# Patient Record
Sex: Female | Born: 1950
Health system: Southern US, Community
[De-identification: ages and names within clinical notes are randomized; demographics above are authoritative.]

## PROBLEM LIST (undated history)

## (undated) DIAGNOSIS — E119 Type 2 diabetes mellitus without complications: Secondary | ICD-10-CM

## (undated) DIAGNOSIS — E785 Hyperlipidemia, unspecified: Secondary | ICD-10-CM

## (undated) DIAGNOSIS — K219 Gastro-esophageal reflux disease without esophagitis: Secondary | ICD-10-CM

## (undated) DIAGNOSIS — M1990 Unspecified osteoarthritis, unspecified site: Secondary | ICD-10-CM

## (undated) DIAGNOSIS — N189 Chronic kidney disease, unspecified: Secondary | ICD-10-CM

## (undated) DIAGNOSIS — I10 Essential (primary) hypertension: Secondary | ICD-10-CM

## (undated) DIAGNOSIS — F419 Anxiety disorder, unspecified: Secondary | ICD-10-CM

## (undated) HISTORY — PX: EYE SURGERY: SHX253

## (undated) HISTORY — PX: ABDOMINAL HYSTERECTOMY: SHX81

## (undated) HISTORY — PX: COLONOSCOPY: SHX174

## (undated) HISTORY — PX: ADENOIDECTOMY: SUR15

## (undated) HISTORY — PX: ROTATOR CUFF REPAIR: SHX139

## (undated) HISTORY — PX: TONSILLECTOMY: SUR1361

---

## 2001-09-28 ENCOUNTER — Emergency Department (HOSPITAL_COMMUNITY): Admission: EM | Admit: 2001-09-28 | Discharge: 2001-09-28 | Payer: Self-pay

## 2001-09-28 ENCOUNTER — Encounter: Payer: Self-pay | Admitting: Emergency Medicine

## 2004-02-22 ENCOUNTER — Ambulatory Visit (HOSPITAL_COMMUNITY): Admission: RE | Admit: 2004-02-22 | Discharge: 2004-02-22 | Payer: Self-pay | Admitting: Orthopaedic Surgery

## 2004-02-22 ENCOUNTER — Ambulatory Visit (HOSPITAL_BASED_OUTPATIENT_CLINIC_OR_DEPARTMENT_OTHER): Admission: RE | Admit: 2004-02-22 | Discharge: 2004-02-22 | Payer: Self-pay | Admitting: Orthopaedic Surgery

## 2005-02-22 ENCOUNTER — Ambulatory Visit (HOSPITAL_COMMUNITY): Admission: RE | Admit: 2005-02-22 | Discharge: 2005-02-23 | Payer: Self-pay | Admitting: Ophthalmology

## 2008-09-27 ENCOUNTER — Ambulatory Visit: Payer: Self-pay | Admitting: Internal Medicine

## 2008-10-15 ENCOUNTER — Ambulatory Visit: Payer: Self-pay | Admitting: Surgery

## 2008-10-25 ENCOUNTER — Ambulatory Visit: Payer: Self-pay | Admitting: Surgery

## 2010-02-04 IMAGING — NM NUCLEAR MEDICINE HEPATOHBILIARY INCLUDE GB
2 series · 12 of 12 positions shown · non-contrast
Comparison: none

REASON FOR EXAM: RUQ ABD PAIN HIDA SCAN IF US NORMAL
COMMENTS:

[Series 1000: gallbladder dynamic (results) · 4.80mm/px · 6 of 60 frames shown]
[frame 6/60]
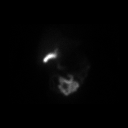
[frame 16/60]
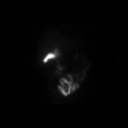
[frame 26/60]
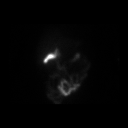
[frame 36/60]
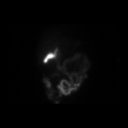
[frame 46/60]
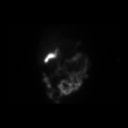
[frame 56/60]
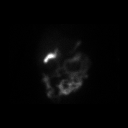

[Series 1000: gallbladder dynamic · 4.80mm/px · 6 of 60 frames shown]
[frame 6/60]
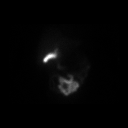
[frame 16/60]
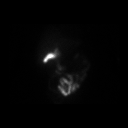
[frame 26/60]
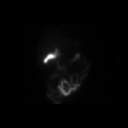
[frame 36/60]
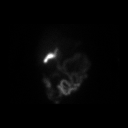
[frame 46/60]
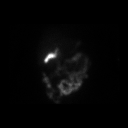
[frame 56/60]
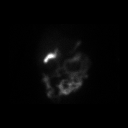

[12 of 12 positions shown; findings below may reference images not displayed]

PROCEDURE:     NM  - NM HEPATO WITH GB EJECT FRACTION  - September 27, 2008 [DATE]

RESULT:     The patient received 6.62 mCi of technetium 99m labeled Choletec
for this study. The patient also received an intravenous drip infusion of
2.3 mcg of sincalide over 30 minutes. There is adequate uptake of the
radiopharmaceutical by the liver. Common bile duct centrally is visible by
approximately 10 minutes and the gallbladder is visible by 15 minutes. Bowel
activity becomes evident by 40 minutes. The 30 minute gallbladder ejection
fraction is abnormally low at only 28% with 35% being the general the
generally accepted lower limit of normal.
IMPRESSION: There are findings consistent with gallbladder dysfunction
with a low ejection fraction of only 28%. I do not see evidence of
structural the common bile duct nor of the cystic duct.

## 2010-11-30 ENCOUNTER — Ambulatory Visit: Payer: Self-pay | Admitting: Internal Medicine

## 2012-02-05 ENCOUNTER — Ambulatory Visit: Payer: Self-pay | Admitting: Internal Medicine

## 2012-05-21 HISTORY — PX: CHOLECYSTECTOMY: SHX55

## 2013-04-10 ENCOUNTER — Ambulatory Visit: Payer: Self-pay | Admitting: Gastroenterology

## 2013-04-13 LAB — PATHOLOGY REPORT

## 2013-05-21 HISTORY — PX: JOINT REPLACEMENT: SHX530

## 2013-06-14 IMAGING — MG MM CAD SCREENING MAMMO
1 series · 4 of 4 positions shown · non-contrast
Comparison: none

REASON FOR EXAM: SCR MAMMO NO ORDER
COMMENTS:

PROCEDURE:     MAM - MAM DGTL SCRN MAM NO ORDER W/CAD  - February 05, 2012  [DATE]
RESULT:     Bilateral mammograms obtained reveals a nodular parenchymal
pattern with fibroglandular breasts. Benign calcifications. CAD evaluation
nonfocal. Benign calcification present.

[R CC · right · 4 of 4 slices shown]
[im 1/4]
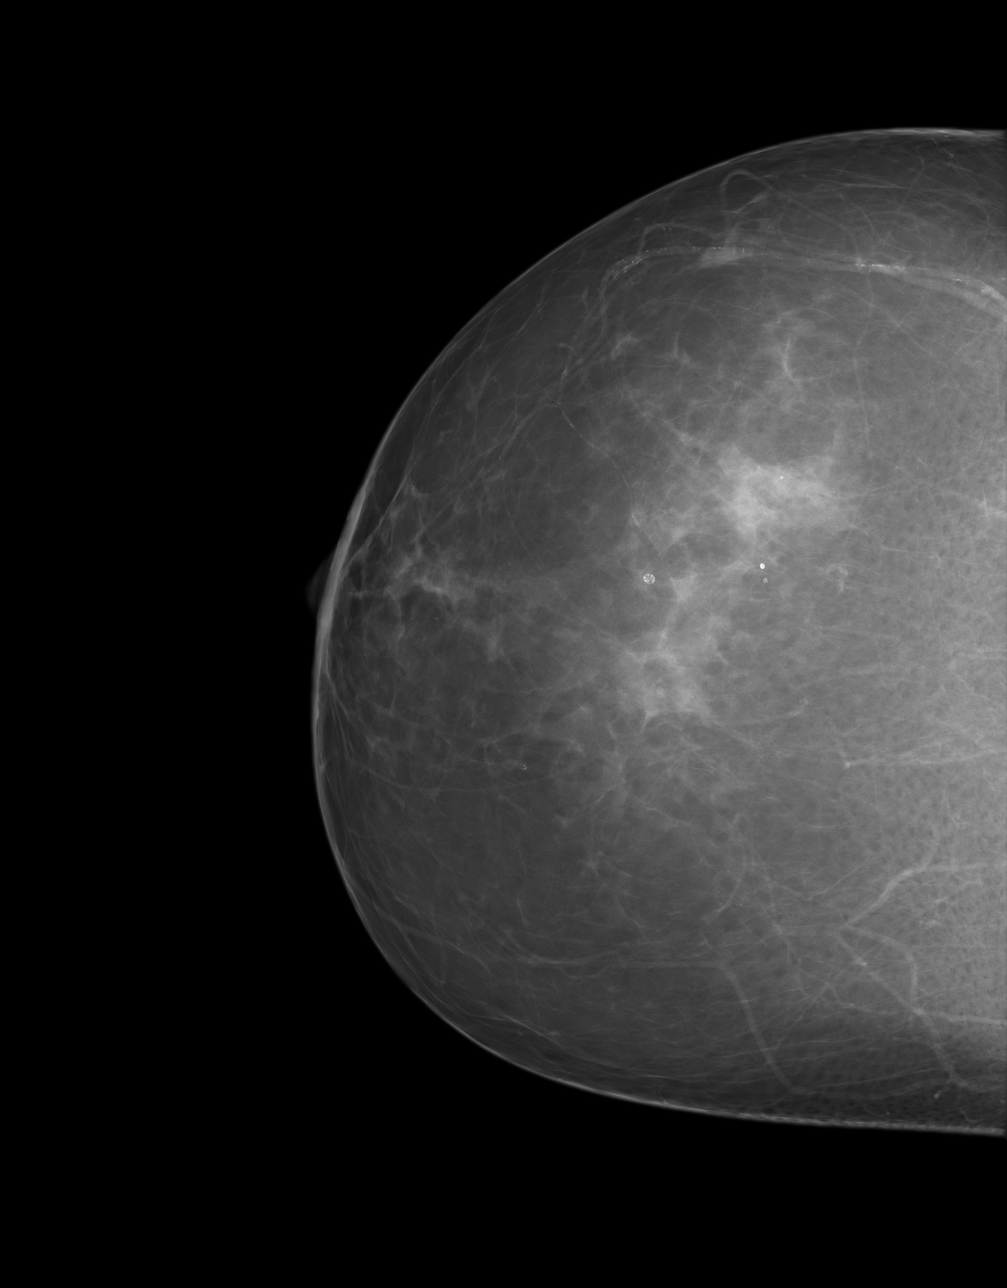
[im 2/4]
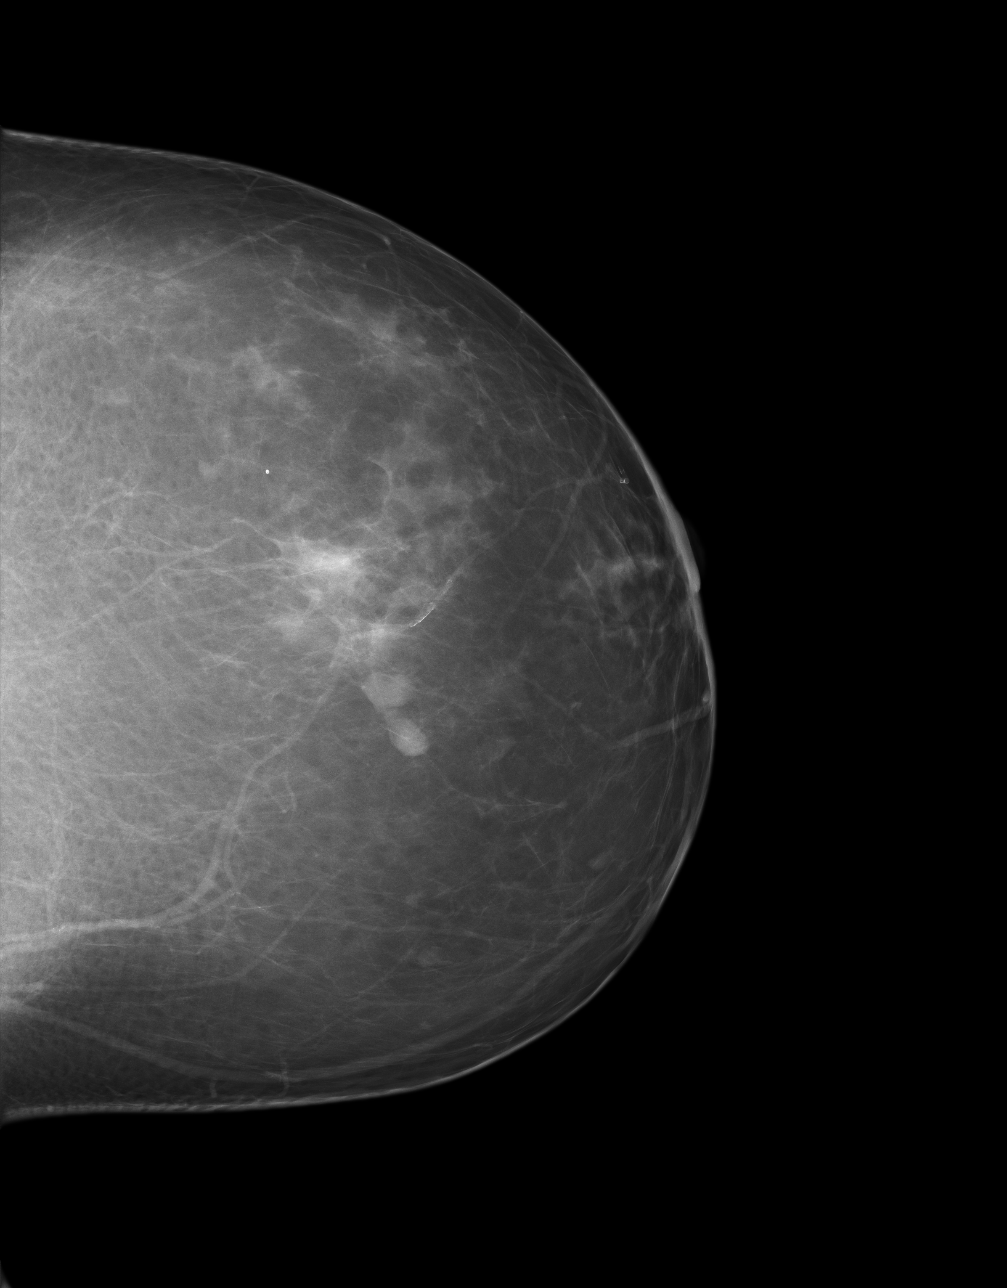
[im 3/4]
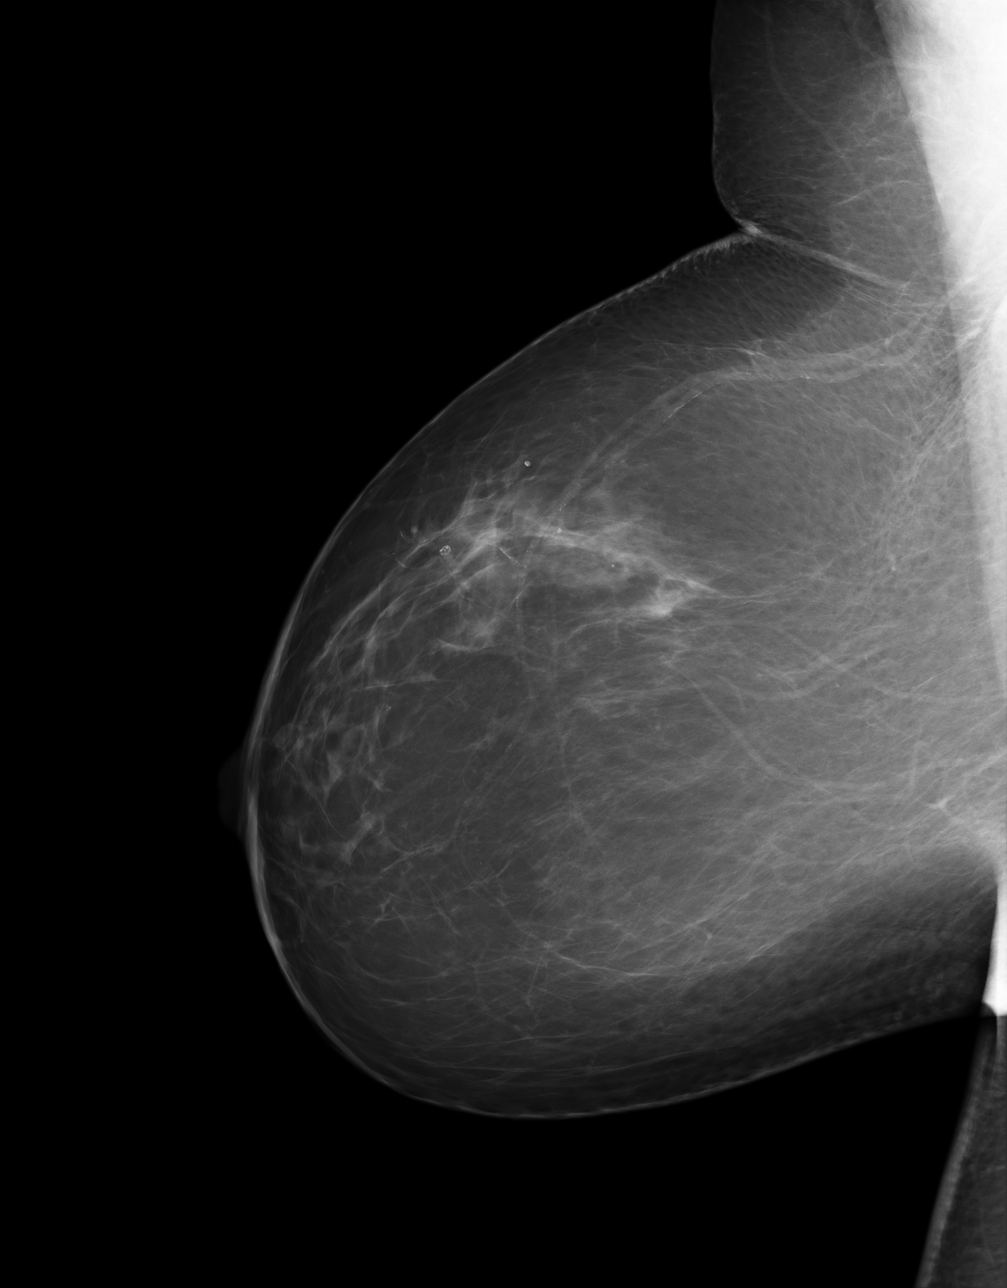
[im 4/4]
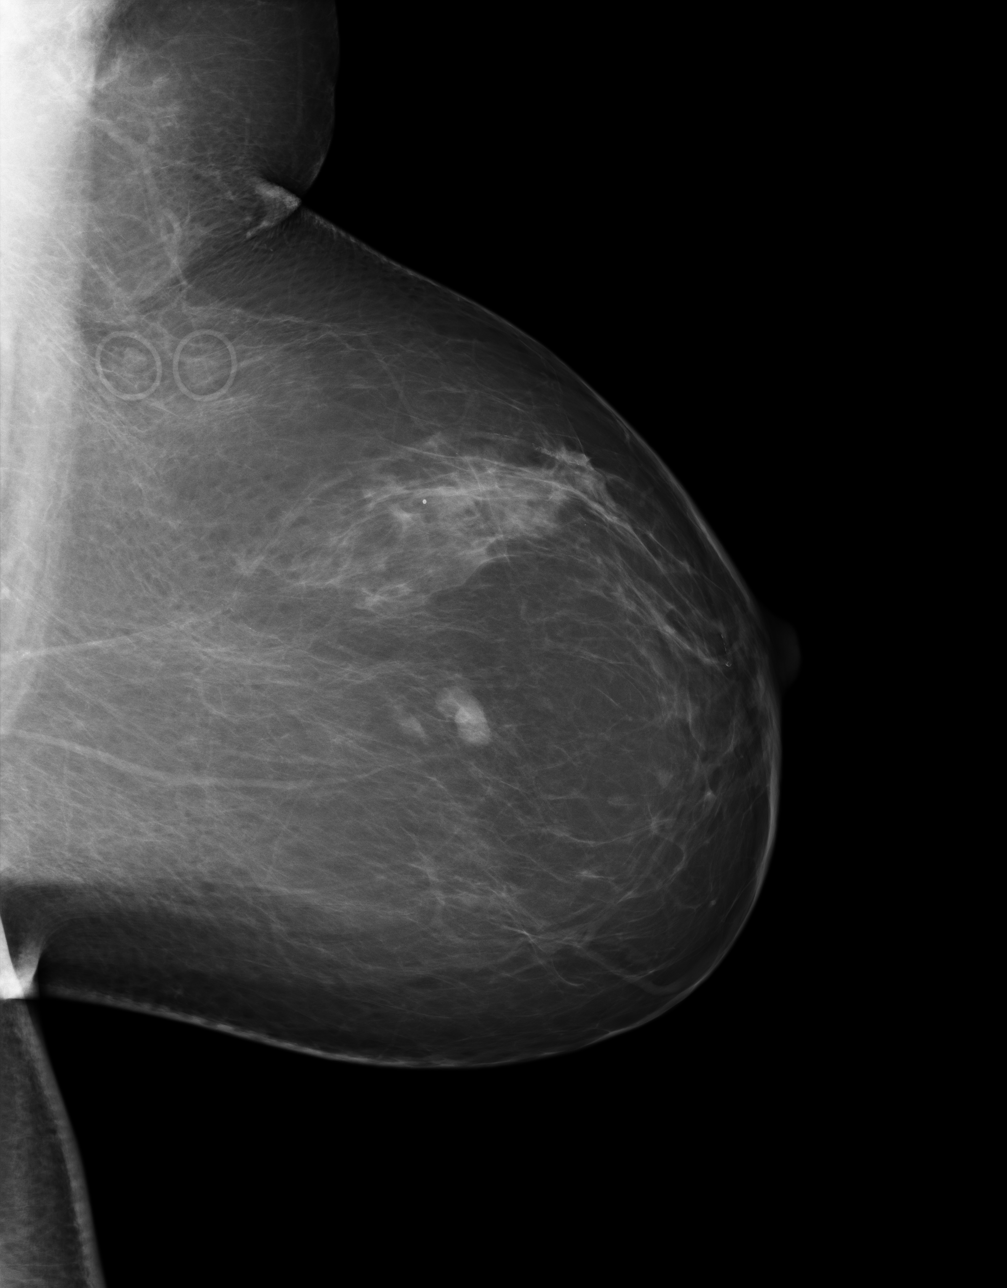

[4 of 4 positions shown; findings below may reference images not displayed]

IMPRESSION: Benign exam. Routine yearly followup barium suggested.

BI-RADS: Category 2- Benign Finding

A NEGATIVE MAMMOGRAM REPORT DOES NOT PRECLUDE BIOPSY OR OTHER EVALUATION OF
A CLINICALLY PALPABLE OR OTHERWISE SUSPICIOUS MASS OR LESION. BREAST CANCER
MAY NOT BE DETECTED IN UP TO 10% OF CASES.

## 2014-03-23 ENCOUNTER — Ambulatory Visit: Payer: Self-pay | Admitting: Internal Medicine

## 2015-03-01 ENCOUNTER — Other Ambulatory Visit: Payer: Self-pay | Admitting: Internal Medicine

## 2015-03-01 DIAGNOSIS — Z1231 Encounter for screening mammogram for malignant neoplasm of breast: Secondary | ICD-10-CM

## 2015-03-28 ENCOUNTER — Ambulatory Visit: Payer: Self-pay | Attending: Internal Medicine

## 2018-05-21 HISTORY — PX: KNEE ARTHROSCOPY: SUR90

## 2018-06-04 ENCOUNTER — Other Ambulatory Visit: Payer: Self-pay

## 2018-06-04 DIAGNOSIS — I83892 Varicose veins of left lower extremities with other complications: Secondary | ICD-10-CM

## 2018-07-15 ENCOUNTER — Encounter (HOSPITAL_COMMUNITY): Payer: Self-pay

## 2018-07-15 ENCOUNTER — Encounter: Payer: Self-pay | Admitting: Vascular Surgery

## 2018-10-21 ENCOUNTER — Ambulatory Visit (HOSPITAL_COMMUNITY): Admission: RE | Admit: 2018-10-21 | Payer: Medicare Other | Source: Ambulatory Visit

## 2018-10-21 ENCOUNTER — Encounter: Payer: Self-pay | Admitting: Vascular Surgery

## 2019-12-27 DIAGNOSIS — J019 Acute sinusitis, unspecified: Secondary | ICD-10-CM | POA: Diagnosis not present

## 2019-12-27 DIAGNOSIS — B9689 Other specified bacterial agents as the cause of diseases classified elsewhere: Secondary | ICD-10-CM | POA: Diagnosis not present

## 2020-01-12 DIAGNOSIS — E119 Type 2 diabetes mellitus without complications: Secondary | ICD-10-CM | POA: Diagnosis not present

## 2020-01-19 ENCOUNTER — Other Ambulatory Visit: Payer: Self-pay | Admitting: Internal Medicine

## 2020-01-19 DIAGNOSIS — E119 Type 2 diabetes mellitus without complications: Secondary | ICD-10-CM | POA: Diagnosis not present

## 2020-05-17 DIAGNOSIS — E119 Type 2 diabetes mellitus without complications: Secondary | ICD-10-CM | POA: Diagnosis not present

## 2020-05-24 ENCOUNTER — Other Ambulatory Visit: Payer: Self-pay | Admitting: Internal Medicine

## 2020-05-24 DIAGNOSIS — E7800 Pure hypercholesterolemia, unspecified: Secondary | ICD-10-CM | POA: Diagnosis not present

## 2020-05-24 DIAGNOSIS — Z0000 Encounter for general adult medical examination without abnormal findings: Secondary | ICD-10-CM | POA: Diagnosis not present

## 2020-05-24 DIAGNOSIS — E119 Type 2 diabetes mellitus without complications: Secondary | ICD-10-CM | POA: Diagnosis not present

## 2020-05-27 DIAGNOSIS — M79645 Pain in left finger(s): Secondary | ICD-10-CM | POA: Diagnosis not present

## 2020-06-16 DIAGNOSIS — M65332 Trigger finger, left middle finger: Secondary | ICD-10-CM | POA: Diagnosis not present

## 2020-06-30 ENCOUNTER — Other Ambulatory Visit: Payer: Self-pay | Admitting: Specialist

## 2020-07-18 ENCOUNTER — Other Ambulatory Visit: Payer: Self-pay | Admitting: Internal Medicine

## 2020-07-21 DIAGNOSIS — H524 Presbyopia: Secondary | ICD-10-CM | POA: Diagnosis not present

## 2020-07-21 DIAGNOSIS — H04123 Dry eye syndrome of bilateral lacrimal glands: Secondary | ICD-10-CM | POA: Diagnosis not present

## 2020-07-21 DIAGNOSIS — E119 Type 2 diabetes mellitus without complications: Secondary | ICD-10-CM | POA: Diagnosis not present

## 2020-07-21 DIAGNOSIS — H59811 Chorioretinal scars after surgery for detachment, right eye: Secondary | ICD-10-CM | POA: Diagnosis not present

## 2020-07-21 DIAGNOSIS — H43811 Vitreous degeneration, right eye: Secondary | ICD-10-CM | POA: Diagnosis not present

## 2020-09-24 MED FILL — Rosuvastatin Calcium Tab 10 MG: ORAL | 90 days supply | Qty: 90 | Fill #0 | Status: AC

## 2020-09-26 ENCOUNTER — Other Ambulatory Visit: Payer: Self-pay

## 2020-10-10 ENCOUNTER — Other Ambulatory Visit: Payer: Self-pay

## 2020-10-10 MED ORDER — AMOXICILLIN-POT CLAVULANATE 500-125 MG PO TABS
ORAL_TABLET | ORAL | 0 refills | Status: AC
  Filled 2020-10-10: qty 21, 7d supply, fill #0

## 2020-10-10 MED ORDER — PREDNISONE 20 MG PO TABS
ORAL_TABLET | ORAL | 0 refills | Status: AC
  Filled 2020-10-10: qty 7, 7d supply, fill #0

## 2020-10-12 ENCOUNTER — Other Ambulatory Visit: Payer: Self-pay

## 2020-10-12 MED ORDER — DOXYCYCLINE HYCLATE 100 MG PO CAPS
100.0000 mg | ORAL_CAPSULE | Freq: Two times a day (BID) | ORAL | 0 refills | Status: AC
  Filled 2020-10-12: qty 14, 7d supply, fill #0

## 2020-10-15 MED FILL — Alprazolam Tab 0.5 MG: ORAL | 90 days supply | Qty: 180 | Fill #0 | Status: AC

## 2020-10-18 ENCOUNTER — Other Ambulatory Visit: Payer: Self-pay

## 2020-10-31 ENCOUNTER — Other Ambulatory Visit: Payer: Self-pay

## 2020-10-31 DIAGNOSIS — H33102 Unspecified retinoschisis, left eye: Secondary | ICD-10-CM | POA: Diagnosis not present

## 2020-10-31 DIAGNOSIS — H25811 Combined forms of age-related cataract, right eye: Secondary | ICD-10-CM | POA: Diagnosis not present

## 2020-10-31 DIAGNOSIS — H2512 Age-related nuclear cataract, left eye: Secondary | ICD-10-CM | POA: Diagnosis not present

## 2020-10-31 MED ORDER — OFLOXACIN 0.3 % OP SOLN
OPHTHALMIC | 0 refills | Status: AC
  Filled 2020-10-31: qty 10, 15d supply, fill #0

## 2020-10-31 MED ORDER — VALACYCLOVIR HCL 500 MG PO TABS
500.0000 mg | ORAL_TABLET | Freq: Two times a day (BID) | ORAL | 3 refills | Status: AC
  Filled 2020-10-31: qty 180, 90d supply, fill #0

## 2020-11-02 DIAGNOSIS — H35342 Macular cyst, hole, or pseudohole, left eye: Secondary | ICD-10-CM | POA: Diagnosis not present

## 2020-11-02 DIAGNOSIS — H2512 Age-related nuclear cataract, left eye: Secondary | ICD-10-CM | POA: Diagnosis not present

## 2020-11-02 DIAGNOSIS — H25811 Combined forms of age-related cataract, right eye: Secondary | ICD-10-CM | POA: Diagnosis not present

## 2020-11-02 DIAGNOSIS — H33321 Round hole, right eye: Secondary | ICD-10-CM | POA: Diagnosis not present

## 2020-11-03 DIAGNOSIS — M9902 Segmental and somatic dysfunction of thoracic region: Secondary | ICD-10-CM | POA: Diagnosis not present

## 2020-11-03 DIAGNOSIS — M5137 Other intervertebral disc degeneration, lumbosacral region: Secondary | ICD-10-CM | POA: Diagnosis not present

## 2020-11-03 DIAGNOSIS — M9901 Segmental and somatic dysfunction of cervical region: Secondary | ICD-10-CM | POA: Diagnosis not present

## 2020-11-03 DIAGNOSIS — M955 Acquired deformity of pelvis: Secondary | ICD-10-CM | POA: Diagnosis not present

## 2020-11-03 DIAGNOSIS — M546 Pain in thoracic spine: Secondary | ICD-10-CM | POA: Diagnosis not present

## 2020-11-03 DIAGNOSIS — M9905 Segmental and somatic dysfunction of pelvic region: Secondary | ICD-10-CM | POA: Diagnosis not present

## 2020-11-03 DIAGNOSIS — M531 Cervicobrachial syndrome: Secondary | ICD-10-CM | POA: Diagnosis not present

## 2020-11-03 DIAGNOSIS — M9903 Segmental and somatic dysfunction of lumbar region: Secondary | ICD-10-CM | POA: Diagnosis not present

## 2020-11-10 DIAGNOSIS — M9901 Segmental and somatic dysfunction of cervical region: Secondary | ICD-10-CM | POA: Diagnosis not present

## 2020-11-10 DIAGNOSIS — M9902 Segmental and somatic dysfunction of thoracic region: Secondary | ICD-10-CM | POA: Diagnosis not present

## 2020-11-10 DIAGNOSIS — M531 Cervicobrachial syndrome: Secondary | ICD-10-CM | POA: Diagnosis not present

## 2020-11-10 DIAGNOSIS — M5137 Other intervertebral disc degeneration, lumbosacral region: Secondary | ICD-10-CM | POA: Diagnosis not present

## 2020-11-10 DIAGNOSIS — M546 Pain in thoracic spine: Secondary | ICD-10-CM | POA: Diagnosis not present

## 2020-11-10 DIAGNOSIS — M9905 Segmental and somatic dysfunction of pelvic region: Secondary | ICD-10-CM | POA: Diagnosis not present

## 2020-11-10 DIAGNOSIS — M9903 Segmental and somatic dysfunction of lumbar region: Secondary | ICD-10-CM | POA: Diagnosis not present

## 2020-11-10 DIAGNOSIS — M955 Acquired deformity of pelvis: Secondary | ICD-10-CM | POA: Diagnosis not present

## 2020-11-16 DIAGNOSIS — E119 Type 2 diabetes mellitus without complications: Secondary | ICD-10-CM | POA: Diagnosis not present

## 2020-11-17 DIAGNOSIS — M531 Cervicobrachial syndrome: Secondary | ICD-10-CM | POA: Diagnosis not present

## 2020-11-17 DIAGNOSIS — M5137 Other intervertebral disc degeneration, lumbosacral region: Secondary | ICD-10-CM | POA: Diagnosis not present

## 2020-11-17 DIAGNOSIS — M9901 Segmental and somatic dysfunction of cervical region: Secondary | ICD-10-CM | POA: Diagnosis not present

## 2020-11-17 DIAGNOSIS — M9905 Segmental and somatic dysfunction of pelvic region: Secondary | ICD-10-CM | POA: Diagnosis not present

## 2020-11-17 DIAGNOSIS — M9902 Segmental and somatic dysfunction of thoracic region: Secondary | ICD-10-CM | POA: Diagnosis not present

## 2020-11-17 DIAGNOSIS — M9903 Segmental and somatic dysfunction of lumbar region: Secondary | ICD-10-CM | POA: Diagnosis not present

## 2020-11-17 DIAGNOSIS — M955 Acquired deformity of pelvis: Secondary | ICD-10-CM | POA: Diagnosis not present

## 2020-11-17 DIAGNOSIS — M546 Pain in thoracic spine: Secondary | ICD-10-CM | POA: Diagnosis not present

## 2020-11-19 MED FILL — Bisoprolol Fumarate Tab 5 MG: ORAL | 90 days supply | Qty: 90 | Fill #0 | Status: AC

## 2020-11-19 MED FILL — Furosemide Tab 20 MG: ORAL | 90 days supply | Qty: 180 | Fill #0 | Status: AC

## 2020-11-19 MED FILL — Potassium Chloride Microencapsulated Crys ER Tab 20 mEq: ORAL | 90 days supply | Qty: 180 | Fill #0 | Status: AC

## 2020-11-22 ENCOUNTER — Other Ambulatory Visit: Payer: Self-pay

## 2020-11-23 ENCOUNTER — Other Ambulatory Visit: Payer: Self-pay

## 2020-11-23 DIAGNOSIS — Z0000 Encounter for general adult medical examination without abnormal findings: Secondary | ICD-10-CM | POA: Diagnosis not present

## 2020-11-23 DIAGNOSIS — E119 Type 2 diabetes mellitus without complications: Secondary | ICD-10-CM | POA: Diagnosis not present

## 2020-11-23 MED ORDER — BISOPROLOL FUMARATE 5 MG PO TABS
ORAL_TABLET | ORAL | 3 refills | Status: AC
  Filled 2020-11-23 – 2021-02-28 (×2): qty 90, 90d supply, fill #0
  Filled 2021-05-30: qty 90, 90d supply, fill #1
  Filled 2021-08-28: qty 90, 90d supply, fill #2

## 2020-11-23 MED ORDER — POTASSIUM CHLORIDE CRYS ER 20 MEQ PO TBCR
20.0000 meq | EXTENDED_RELEASE_TABLET | Freq: Two times a day (BID) | ORAL | 3 refills | Status: AC
  Filled 2020-11-23 – 2021-02-28 (×2): qty 180, 90d supply, fill #0
  Filled 2021-05-30: qty 180, 90d supply, fill #1
  Filled 2021-08-28: qty 180, 90d supply, fill #2

## 2020-11-23 MED ORDER — FUROSEMIDE 20 MG PO TABS
20.0000 mg | ORAL_TABLET | Freq: Two times a day (BID) | ORAL | 3 refills | Status: AC
  Filled 2020-11-23 – 2021-02-28 (×2): qty 180, 90d supply, fill #0

## 2020-11-23 MED ORDER — BUDESONIDE-FORMOTEROL FUMARATE 80-4.5 MCG/ACT IN AERO
INHALATION_SPRAY | RESPIRATORY_TRACT | 3 refills | Status: AC
  Filled 2020-11-23: qty 10.2, 30d supply, fill #0

## 2020-11-23 MED ORDER — ALPRAZOLAM 0.5 MG PO TABS
ORAL_TABLET | ORAL | 1 refills | Status: DC
  Filled 2020-11-23 – 2021-01-14 (×2): qty 180, 90d supply, fill #0
  Filled 2021-04-16: qty 180, 90d supply, fill #1

## 2020-11-30 ENCOUNTER — Other Ambulatory Visit: Payer: Self-pay

## 2020-12-06 ENCOUNTER — Other Ambulatory Visit (HOSPITAL_COMMUNITY): Payer: Self-pay

## 2020-12-08 DIAGNOSIS — M5137 Other intervertebral disc degeneration, lumbosacral region: Secondary | ICD-10-CM | POA: Diagnosis not present

## 2020-12-08 DIAGNOSIS — M9902 Segmental and somatic dysfunction of thoracic region: Secondary | ICD-10-CM | POA: Diagnosis not present

## 2020-12-08 DIAGNOSIS — M9901 Segmental and somatic dysfunction of cervical region: Secondary | ICD-10-CM | POA: Diagnosis not present

## 2020-12-08 DIAGNOSIS — M546 Pain in thoracic spine: Secondary | ICD-10-CM | POA: Diagnosis not present

## 2020-12-08 DIAGNOSIS — M531 Cervicobrachial syndrome: Secondary | ICD-10-CM | POA: Diagnosis not present

## 2020-12-08 DIAGNOSIS — M9905 Segmental and somatic dysfunction of pelvic region: Secondary | ICD-10-CM | POA: Diagnosis not present

## 2020-12-08 DIAGNOSIS — M955 Acquired deformity of pelvis: Secondary | ICD-10-CM | POA: Diagnosis not present

## 2020-12-08 DIAGNOSIS — M9903 Segmental and somatic dysfunction of lumbar region: Secondary | ICD-10-CM | POA: Diagnosis not present

## 2020-12-12 DIAGNOSIS — H2513 Age-related nuclear cataract, bilateral: Secondary | ICD-10-CM | POA: Diagnosis not present

## 2020-12-12 DIAGNOSIS — F419 Anxiety disorder, unspecified: Secondary | ICD-10-CM | POA: Diagnosis not present

## 2020-12-12 DIAGNOSIS — E1122 Type 2 diabetes mellitus with diabetic chronic kidney disease: Secondary | ICD-10-CM | POA: Diagnosis not present

## 2020-12-12 DIAGNOSIS — N189 Chronic kidney disease, unspecified: Secondary | ICD-10-CM | POA: Diagnosis not present

## 2020-12-12 DIAGNOSIS — I129 Hypertensive chronic kidney disease with stage 1 through stage 4 chronic kidney disease, or unspecified chronic kidney disease: Secondary | ICD-10-CM | POA: Diagnosis not present

## 2020-12-12 DIAGNOSIS — Z880 Allergy status to penicillin: Secondary | ICD-10-CM | POA: Diagnosis not present

## 2020-12-12 DIAGNOSIS — H269 Unspecified cataract: Secondary | ICD-10-CM | POA: Diagnosis not present

## 2020-12-12 DIAGNOSIS — Z79899 Other long term (current) drug therapy: Secondary | ICD-10-CM | POA: Diagnosis not present

## 2020-12-12 DIAGNOSIS — H2589 Other age-related cataract: Secondary | ICD-10-CM | POA: Diagnosis not present

## 2020-12-12 DIAGNOSIS — Z882 Allergy status to sulfonamides status: Secondary | ICD-10-CM | POA: Diagnosis not present

## 2020-12-24 MED FILL — Bisoprolol Fumarate Tab 5 MG: ORAL | 90 days supply | Qty: 90 | Fill #1 | Status: CN

## 2020-12-25 ENCOUNTER — Other Ambulatory Visit: Payer: Self-pay

## 2020-12-27 ENCOUNTER — Other Ambulatory Visit: Payer: Self-pay

## 2020-12-27 DIAGNOSIS — Z86010 Personal history of colonic polyps: Secondary | ICD-10-CM | POA: Diagnosis not present

## 2020-12-27 MED ORDER — PEG 3350-KCL-NA BICARB-NACL 420 G PO SOLR
ORAL | 0 refills | Status: AC
  Filled 2020-12-27: qty 4000, 1d supply, fill #0

## 2020-12-27 MED FILL — Rosuvastatin Calcium Tab 10 MG: ORAL | 90 days supply | Qty: 90 | Fill #1 | Status: AC

## 2021-01-09 DIAGNOSIS — M9901 Segmental and somatic dysfunction of cervical region: Secondary | ICD-10-CM | POA: Diagnosis not present

## 2021-01-09 DIAGNOSIS — M955 Acquired deformity of pelvis: Secondary | ICD-10-CM | POA: Diagnosis not present

## 2021-01-09 DIAGNOSIS — M9902 Segmental and somatic dysfunction of thoracic region: Secondary | ICD-10-CM | POA: Diagnosis not present

## 2021-01-09 DIAGNOSIS — M546 Pain in thoracic spine: Secondary | ICD-10-CM | POA: Diagnosis not present

## 2021-01-09 DIAGNOSIS — M5137 Other intervertebral disc degeneration, lumbosacral region: Secondary | ICD-10-CM | POA: Diagnosis not present

## 2021-01-09 DIAGNOSIS — M531 Cervicobrachial syndrome: Secondary | ICD-10-CM | POA: Diagnosis not present

## 2021-01-09 DIAGNOSIS — M9903 Segmental and somatic dysfunction of lumbar region: Secondary | ICD-10-CM | POA: Diagnosis not present

## 2021-01-09 DIAGNOSIS — M9905 Segmental and somatic dysfunction of pelvic region: Secondary | ICD-10-CM | POA: Diagnosis not present

## 2021-01-16 ENCOUNTER — Other Ambulatory Visit: Payer: Self-pay

## 2021-02-06 DIAGNOSIS — Z961 Presence of intraocular lens: Secondary | ICD-10-CM | POA: Diagnosis not present

## 2021-02-06 DIAGNOSIS — H35342 Macular cyst, hole, or pseudohole, left eye: Secondary | ICD-10-CM | POA: Diagnosis not present

## 2021-02-06 DIAGNOSIS — H2512 Age-related nuclear cataract, left eye: Secondary | ICD-10-CM | POA: Diagnosis not present

## 2021-02-08 ENCOUNTER — Other Ambulatory Visit (HOSPITAL_COMMUNITY): Payer: Self-pay

## 2021-02-09 DIAGNOSIS — M9902 Segmental and somatic dysfunction of thoracic region: Secondary | ICD-10-CM | POA: Diagnosis not present

## 2021-02-09 DIAGNOSIS — M5137 Other intervertebral disc degeneration, lumbosacral region: Secondary | ICD-10-CM | POA: Diagnosis not present

## 2021-02-09 DIAGNOSIS — M546 Pain in thoracic spine: Secondary | ICD-10-CM | POA: Diagnosis not present

## 2021-02-09 DIAGNOSIS — M955 Acquired deformity of pelvis: Secondary | ICD-10-CM | POA: Diagnosis not present

## 2021-02-09 DIAGNOSIS — M9901 Segmental and somatic dysfunction of cervical region: Secondary | ICD-10-CM | POA: Diagnosis not present

## 2021-02-09 DIAGNOSIS — M9903 Segmental and somatic dysfunction of lumbar region: Secondary | ICD-10-CM | POA: Diagnosis not present

## 2021-02-09 DIAGNOSIS — M9905 Segmental and somatic dysfunction of pelvic region: Secondary | ICD-10-CM | POA: Diagnosis not present

## 2021-02-09 DIAGNOSIS — M531 Cervicobrachial syndrome: Secondary | ICD-10-CM | POA: Diagnosis not present

## 2021-02-10 DIAGNOSIS — M79672 Pain in left foot: Secondary | ICD-10-CM | POA: Diagnosis not present

## 2021-02-10 DIAGNOSIS — M7989 Other specified soft tissue disorders: Secondary | ICD-10-CM | POA: Diagnosis not present

## 2021-02-17 ENCOUNTER — Encounter: Payer: Self-pay | Admitting: *Deleted

## 2021-02-17 ENCOUNTER — Other Ambulatory Visit: Payer: Self-pay

## 2021-02-17 MED ORDER — FREESTYLE LITE TEST VI STRP
ORAL_STRIP | 12 refills | Status: AC
  Filled 2021-02-17: qty 100, 50d supply, fill #0

## 2021-02-17 MED ORDER — FREESTYLE LANCETS MISC
1 refills | Status: AC
  Filled 2021-02-17: qty 200, 90d supply, fill #0

## 2021-02-17 MED ORDER — GNP ALCOHOL SWABS 70 % PADS
MEDICATED_PAD | 1 refills | Status: AC
  Filled 2021-02-17: qty 200, 90d supply, fill #0

## 2021-02-20 ENCOUNTER — Ambulatory Visit: Payer: 59 | Admitting: Anesthesiology

## 2021-02-20 ENCOUNTER — Encounter
Admission: RE | Disposition: A | Discharge status: Home / Self Care | Payer: Self-pay | Source: Home / Self Care | Attending: Gastroenterology

## 2021-02-20 ENCOUNTER — Ambulatory Visit
Admission: RE | Admit: 2021-02-20 | Discharge: 2021-02-20 | Disposition: A | Discharge status: Home / Self Care | Payer: 59 | Attending: Gastroenterology | Admitting: Gastroenterology

## 2021-02-20 ENCOUNTER — Encounter: Payer: Self-pay | Admitting: *Deleted

## 2021-02-20 ENCOUNTER — Other Ambulatory Visit: Payer: Self-pay

## 2021-02-20 DIAGNOSIS — Z1211 Encounter for screening for malignant neoplasm of colon: Secondary | ICD-10-CM | POA: Insufficient documentation

## 2021-02-20 DIAGNOSIS — Z86010 Personal history of colonic polyps: Secondary | ICD-10-CM | POA: Diagnosis not present

## 2021-02-20 DIAGNOSIS — K635 Polyp of colon: Secondary | ICD-10-CM | POA: Insufficient documentation

## 2021-02-20 DIAGNOSIS — Z7951 Long term (current) use of inhaled steroids: Secondary | ICD-10-CM | POA: Diagnosis not present

## 2021-02-20 DIAGNOSIS — K649 Unspecified hemorrhoids: Secondary | ICD-10-CM | POA: Diagnosis not present

## 2021-02-20 DIAGNOSIS — Z882 Allergy status to sulfonamides status: Secondary | ICD-10-CM | POA: Diagnosis not present

## 2021-02-20 DIAGNOSIS — K219 Gastro-esophageal reflux disease without esophagitis: Secondary | ICD-10-CM | POA: Diagnosis not present

## 2021-02-20 DIAGNOSIS — K640 First degree hemorrhoids: Secondary | ICD-10-CM | POA: Insufficient documentation

## 2021-02-20 DIAGNOSIS — Z79899 Other long term (current) drug therapy: Secondary | ICD-10-CM | POA: Insufficient documentation

## 2021-02-20 DIAGNOSIS — Z91013 Allergy to seafood: Secondary | ICD-10-CM | POA: Diagnosis not present

## 2021-02-20 DIAGNOSIS — Z96652 Presence of left artificial knee joint: Secondary | ICD-10-CM | POA: Insufficient documentation

## 2021-02-20 DIAGNOSIS — Z885 Allergy status to narcotic agent status: Secondary | ICD-10-CM | POA: Insufficient documentation

## 2021-02-20 DIAGNOSIS — Z888 Allergy status to other drugs, medicaments and biological substances status: Secondary | ICD-10-CM | POA: Insufficient documentation

## 2021-02-20 HISTORY — DX: Type 2 diabetes mellitus without complications: E11.9

## 2021-02-20 HISTORY — DX: Chronic kidney disease, unspecified: N18.9

## 2021-02-20 HISTORY — DX: Hyperlipidemia, unspecified: E78.5

## 2021-02-20 HISTORY — DX: Unspecified osteoarthritis, unspecified site: M19.90

## 2021-02-20 HISTORY — DX: Gastro-esophageal reflux disease without esophagitis: K21.9

## 2021-02-20 HISTORY — PX: COLONOSCOPY WITH PROPOFOL: SHX5780

## 2021-02-20 HISTORY — DX: Essential (primary) hypertension: I10

## 2021-02-20 HISTORY — DX: Anxiety disorder, unspecified: F41.9

## 2021-02-20 LAB — GLUCOSE, CAPILLARY: Glucose-Capillary: 94 mg/dL (ref 70–99)

## 2021-02-20 SURGERY — COLONOSCOPY WITH PROPOFOL
Anesthesia: General

## 2021-02-20 MED ORDER — PROPOFOL 500 MG/50ML IV EMUL
INTRAVENOUS | Status: DC | PRN
  Administered 2021-02-20: 150 ug/kg/min via INTRAVENOUS

## 2021-02-20 MED ORDER — SODIUM CHLORIDE 0.9 % IV SOLN: INTRAVENOUS | Status: DC

## 2021-02-20 MED ORDER — PROPOFOL 500 MG/50ML IV EMUL
INTRAVENOUS | Status: AC
  Filled 2021-02-20: qty 50

## 2021-02-20 NOTE — Anesthesia Postprocedure Evaluation (Signed)
Anesthesia Post Note  Patient: Gabriela Graham  Procedure(s) Performed: COLONOSCOPY WITH PROPOFOL  Patient location during evaluation: Endoscopy Anesthesia Type: General Level of consciousness: awake and alert Pain management: pain level controlled Vital Signs Assessment: post-procedure vital signs reviewed and stable Respiratory status: spontaneous breathing, nonlabored ventilation, respiratory function stable and patient connected to nasal cannula oxygen Cardiovascular status: blood pressure returned to baseline and stable Postop Assessment: no apparent nausea or vomiting Anesthetic complications: no   No notable events documented.   Last Vitals:  Vitals:   02/20/21 0805 02/20/21 0815  BP: 96/80 (!) 100/52  Pulse: 68 (!) 55  Resp: 12 12  Temp: (!) 35.8 C   SpO2: 97% 96%    Last Pain:  Vitals:   02/20/21 0815  TempSrc:   PainSc: 0-No pain                 Corinda Gubler

## 2021-02-20 NOTE — Anesthesia Preprocedure Evaluation (Signed)
Anesthesia Evaluation  Patient identified by MRN, date of birth, ID band Patient awake    Reviewed: Allergy & Precautions, NPO status , Patient's Chart, lab work & pertinent test results  History of Anesthesia Complications Negative for: history of anesthetic complications  Airway Mallampati: II  TM Distance: >3 FB Neck ROM: Full    Dental  (+) Upper Dentures Upper dentures cemented in:   Pulmonary neg sleep apnea, neg COPD, Patient abstained from smoking.Not current smoker,  Occasional bronchitis, has not taken inhaler in 6 months   Pulmonary exam normal breath sounds clear to auscultation       Cardiovascular Exercise Tolerance: Good METShypertension, (-) CAD and (-) Past MI (-) dysrhythmias  Rhythm:Regular Rate:Normal - Systolic murmurs    Neuro/Psych PSYCHIATRIC DISORDERS Anxiety negative neurological ROS     GI/Hepatic GERD  ,(+)     (-) substance abuse  ,   Endo/Other  diabetes, Well Controlled  Renal/GU CRFRenal disease     Musculoskeletal   Abdominal   Peds  Hematology   Anesthesia Other Findings Past Medical History: No date: Anxiety No date: Arthritis No date: Chronic kidney disease No date: Diabetes mellitus without complication (HCC) No date: GERD (gastroesophageal reflux disease) No date: Hyperlipidemia No date: Hypertension  Reproductive/Obstetrics                             Anesthesia Physical Anesthesia Plan  ASA: 3  Anesthesia Plan: General   Post-op Pain Management:    Induction: Intravenous  PONV Risk Score and Plan: 3 and Ondansetron, Propofol infusion and TIVA  Airway Management Planned: Nasal Cannula  Additional Equipment: None  Intra-op Plan:   Post-operative Plan:   Informed Consent: I have reviewed the patients History and Physical, chart, labs and discussed the procedure including the risks, benefits and alternatives for the proposed  anesthesia with the patient or authorized representative who has indicated his/her understanding and acceptance.     Dental advisory given  Plan Discussed with: CRNA and Surgeon  Anesthesia Plan Comments: (Discussed risks of anesthesia with patient, including possibility of difficulty with spontaneous ventilation under anesthesia necessitating airway intervention, PONV, and rare risks such as cardiac or respiratory or neurological events, and allergic reactions. Patient understands.)        Anesthesia Quick Evaluation

## 2021-02-20 NOTE — Anesthesia Procedure Notes (Signed)
Date/Time: 02/20/2021 7:43 AM Performed by: Tonia Ghent Pre-anesthesia Checklist: Patient identified, Emergency Drugs available, Suction available, Patient being monitored and Timeout performed Patient Re-evaluated:Patient Re-evaluated prior to induction Oxygen Delivery Method: Nasal cannula Preoxygenation: Pre-oxygenation with 100% oxygen Induction Type: IV induction Placement Confirmation: positive ETCO2 and CO2 detector

## 2021-02-20 NOTE — Interval H&P Note (Signed)
History and Physical Interval Note: Preprocedure H&P from 02/20/21  was reviewed and there was no interval change after seeing and examining the patient.  Written consent was obtained from the patient after discussion of risks, benefits, and alternatives. Patient has consented to proceed with Colonoscopy with possible intervention   02/20/2021 7:33 AM  Gabriela Graham  has presented today for surgery, with the diagnosis of History of Adenomatous polyp of colon.  The various methods of treatment have been discussed with the patient and family. After consideration of risks, benefits and other options for treatment, the patient has consented to  Procedure(s): COLONOSCOPY WITH PROPOFOL (N/A) as a surgical intervention.  The patient's history has been reviewed, patient examined, no change in status, stable for surgery.  I have reviewed the patient's chart and labs.  Questions were answered to the patient's satisfaction.     Jaynie Collins

## 2021-02-20 NOTE — H&P (Signed)
[name] Pre-Procedure H&P   Patient ID: Gabriela Graham is a 70 y.o. female.  Gastroenterology Provider: Jaynie Collins, DO  Referring Provider: Vevelyn Pat, NP PCP: Danella Penton, MD  Date: 02/20/2021  HPI Gabriela Graham is a 70 y.o. female who presents today for Colonoscopy for personal history of colon polyps.  Last colonscopy 2014 with one TA in sigmoid colon.  Notes diarrhea since cholecystectomy, specifically with fatty food. Denies diarrhea, constipation, blood in stool, melena. S/p cholecystectomy and hysterectomy. No Family h/o gi disease or malignancy No other acute GI complaints.  Past Medical History:  Diagnosis Date   Anxiety    Arthritis    Chronic kidney disease    Diabetes mellitus without complication (HCC)    GERD (gastroesophageal reflux disease)    Hyperlipidemia    Hypertension     Past Surgical History:  Procedure Laterality Date   ABDOMINAL HYSTERECTOMY     ADENOIDECTOMY     CHOLECYSTECTOMY  2014   COLONOSCOPY     EYE SURGERY Right    Retinal Surgery   JOINT REPLACEMENT Left 2015   Partial Knee Replacement   KNEE ARTHROSCOPY Left 2020   ROTATOR CUFF REPAIR Left    TONSILLECTOMY      Family History No h/o GI disease or malignancy  Review of Systems  Constitutional:  Negative for activity change, appetite change, fatigue, fever and unexpected weight change.  HENT:  Negative for trouble swallowing and voice change.   Respiratory:  Negative for shortness of breath and wheezing.   Cardiovascular:  Negative for chest pain and palpitations.  Gastrointestinal:  Positive for diarrhea (with fatty food). Negative for abdominal distention, abdominal pain, anal bleeding, blood in stool, constipation, nausea, rectal pain and vomiting.  Musculoskeletal:  Negative for arthralgias and myalgias.  Skin:  Negative for color change and pallor.  Neurological:  Negative for dizziness, syncope and weakness.   Psychiatric/Behavioral:  Negative for confusion.   All other systems reviewed and are negative.   Medications No current facility-administered medications on file prior to encounter.   Current Outpatient Medications on File Prior to Encounter  Medication Sig Dispense Refill   ALPRAZolam (XANAX) 0.5 MG tablet Take 1 tablet (0.5 mg total) by mouth 2 (two) times daily as needed for Sleep 180 tablet 1   azelastine (ASTELIN) 0.1 % nasal spray Place 1 spray into both nostrils 2 (two) times daily. Use in each nostril as directed     bisoprolol (ZEBETA) 5 MG tablet TAKE 1 TABLET (5 MG TOTAL) BY MOUTH ONCE DAILY 90 tablet 3   budesonide-formoterol (SYMBICORT) 80-4.5 MCG/ACT inhaler Inhale into the lungs. 10.2 g 3   fluticasone (FLONASE) 50 MCG/ACT nasal spray Place 2 sprays into both nostrils daily.     furosemide (LASIX) 20 MG tablet TAKE 1 TABLET (20 MG TOTAL) BY MOUTH 2 (TWO) TIMES DAILY 180 tablet 3   potassium chloride SA (KLOR-CON) 20 MEQ tablet TAKE 1 TABLET (20 MEQ TOTAL) BY MOUTH 2 (TWO) TIMES DAILY 180 tablet 3   rosuvastatin (CRESTOR) 10 MG tablet TAKE 1 TABLET BY MOUTH ONCE DAILY 90 tablet 3   valACYclovir (VALTREX) 500 MG tablet Take 1 tablet (500 mg total) by mouth 2 (two) times daily 180 tablet 3   amoxicillin-clavulanate (AUGMENTIN) 500-125 MG tablet Take 1 tablet (500 mg total) by mouth 3 (three) times daily for 7 days (Patient not taking: Reported on 02/17/2021) 21 tablet 0   bisoprolol (ZEBETA) 5 MG tablet Take 1 tablet (  5 mg total) by mouth once daily 90 tablet 3   cephALEXin (KEFLEX) 500 MG capsule TAKE 1 CAPSULE BY MOUTH EVERY 6 HOURS, BEGIN 2 DAYS PRIOR TO PROCEDURE, TAKE DAY OF PROCEDURE AND 2 DAYS AFTER PROCEDURE (Patient not taking: Reported on 02/20/2021) 20 capsule 0   doxycycline (VIBRAMYCIN) 100 MG capsule Take 1 capsule (100 mg total) by mouth 2 (two) times daily for 7 days (Patient not taking: Reported on 02/17/2021) 14 capsule 0   FLUoxetine (PROZAC) 20 MG capsule TAKE 1  CAPSULE (20 MG TOTAL) BY MOUTH ONCE DAILY 90 capsule 3   furosemide (LASIX) 20 MG tablet Take 1 tablet (20 mg total) by mouth 2 (two) times daily 180 tablet 3   ofloxacin (OCUFLOX) 0.3 % ophthalmic solution Use 1 drop in the operative eye four times a day starting 3 days prior to surgery (Patient not taking: Reported on 02/17/2021) 10 mL 0   polyethylene glycol-electrolytes (NULYTELY) 420 g solution Take 4,000 mLs by mouth once for 1 dose 4000 mL 0   potassium chloride SA (KLOR-CON) 20 MEQ tablet Take 1 tablet (20 mEq total) by mouth 2 (two) times daily 180 tablet 3   predniSONE (DELTASONE) 20 MG tablet Take 1 tablet (20 mg total) by mouth once daily for 7 days (Patient not taking: Reported on 02/17/2021) 7 tablet 0    Pertinent medications related to GI and procedure were reviewed by me with the patient prior to the procedure   Current Facility-Administered Medications:    0.9 %  sodium chloride infusion, , Intravenous, Continuous, Jaynie Collins, DO  sodium chloride         Allergies  Allergen Reactions   Shellfish Allergy Anaphylaxis   Zoloft [Sertraline]    Hydrocodone-Acetaminophen Nausea Only   Sulfa Antibiotics Rash   Allergies were reviewed by me prior to the procedure  Objective    Vitals:   02/17/21 1355 02/20/21 0711  BP:  (!) 159/89  Pulse:  (!) 56  Resp:  17  Temp:  97.6 F (36.4 C)  TempSrc:  Temporal  SpO2:  100%  Weight: 86.5 kg 83.9 kg  Height:  5\' 5"  (1.651 m)    Physical Exam Vitals and nursing note reviewed.  Constitutional:      General: She is not in acute distress.    Appearance: Normal appearance. She is not ill-appearing, toxic-appearing or diaphoretic.  HENT:     Head: Normocephalic and atraumatic.     Nose: Nose normal.     Mouth/Throat:     Mouth: Mucous membranes are moist.     Pharynx: Oropharynx is clear.  Eyes:     General: No scleral icterus.    Extraocular Movements: Extraocular movements intact.  Cardiovascular:     Rate  and Rhythm: Normal rate and regular rhythm.     Heart sounds: Normal heart sounds. No murmur heard.   No friction rub. No gallop.  Pulmonary:     Effort: Pulmonary effort is normal. No respiratory distress.     Breath sounds: Normal breath sounds. No wheezing, rhonchi or rales.  Abdominal:     General: Bowel sounds are normal. There is no distension.     Palpations: Abdomen is soft.     Tenderness: There is no abdominal tenderness. There is no guarding or rebound.  Musculoskeletal:     Cervical back: Neck supple.     Right lower leg: No edema.     Left lower leg: No edema.  Skin:  General: Skin is warm and dry.     Coloration: Skin is not jaundiced or pale.  Neurological:     General: No focal deficit present.     Mental Status: She is alert and oriented to person, place, and time. Mental status is at baseline.  Psychiatric:        Mood and Affect: Mood normal.        Behavior: Behavior normal.        Thought Content: Thought content normal.        Judgment: Judgment normal.     Assessment:  Gabriela Graham is a 70 y.o. female  who presents today for Colonoscopy for personal history of colon polyps.  Plan:  Colonoscopy with possible intervention today  Colonoscopy with possible biopsy, control of bleeding, polypectomy, and interventions as necessary has been discussed with the patient/patient representative. Informed consent was obtained from the patient/patient representative after explaining the indication, nature, and risks of the procedure including but not limited to death, bleeding, perforation, missed neoplasm/lesions, cardiorespiratory compromise, and reaction to medications. Opportunity for questions was given and appropriate answers were provided. Patient/patient representative has verbalized understanding is amenable to undergoing the procedure.   Jaynie Collins, DO  [facility]  Portions of the record may have been created with voice  recognition software. Occasional wrong-word or 'sound-a-like' substitutions may have occurred due to the inherent limitations of voice recognition software.  Read the chart carefully and recognize, using context, where substitutions may have occurred.

## 2021-02-20 NOTE — Op Note (Signed)
[facility] Patient Name: Gabriela Graham Procedure Date: 02/20/2021 7:34 AM MRN: [id number] Account #: [id number] Date of Birth: [birth year] Admit Type: Outpatient Age: 70 Room: [facility] ENDO ROOM 2 Gender: Female Note Status: Finalized Instrument Name: Colonoscope [id number] Procedure:             Colonoscopy Indications:           High risk colon cancer surveillance: Personal history                         of colonic polyps Providers:             Rueben Bash, DO Referring MD:          Rusty Aus, MD (Referring MD) Medicines:             Monitored Anesthesia Care Complications:         No immediate complications. Estimated blood loss:                         Minimal. Procedure:             Pre-Anesthesia Assessment:                        - Prior to the procedure, a History and Physical was                         performed, and patient medications and allergies were                         reviewed. The patient is competent. The risks and                         benefits of the procedure and the sedation options and                         risks were discussed with the patient. All questions                         were answered and informed consent was obtained.                         Patient identification and proposed procedure were                         verified by the physician, the nurse, the anesthetist                         and the technician in the endoscopy suite. Mental                         Status Examination: alert and oriented. Airway                         Examination: normal oropharyngeal airway and neck                         mobility. Respiratory Examination: clear to  auscultation. CV Examination: RRR, no murmurs, no S3                         or S4. Prophylactic Antibiotics: The patient does not                         require prophylactic antibiotics. Prior                          Anticoagulants: The patient has taken no previous                         anticoagulant or antiplatelet agents. ASA Grade                         Assessment: III - A patient with severe systemic                         disease. After reviewing the risks and benefits, the                         patient was deemed in satisfactory condition to                         undergo the procedure. The anesthesia plan was to use                         monitored anesthesia care (MAC). Immediately prior to                         administration of medications, the patient was                         re-assessed for adequacy to receive sedatives. The                         heart rate, respiratory rate, oxygen saturations,                         blood pressure, adequacy of pulmonary ventilation, and                         response to care were monitored throughout the                         procedure. The physical status of the patient was                         re-assessed after the procedure.                        After obtaining informed consent, the colonoscope was                         passed under direct vision. Throughout the procedure,                         the patient's blood pressure, pulse, and oxygen  saturations were monitored continuously. The                         Colonoscope was introduced through the anus and                         advanced to the the terminal ileum, with                         identification of the appendiceal orifice and IC                         valve. The colonoscopy was performed without                         difficulty. The patient tolerated the procedure well.                         The quality of the bowel preparation was evaluated                         using the BBPS [facility] Bowel Preparation Scale) with                         scores of: Right Colon = 2 (minor amount of residual                         staining, small  fragments of stool and/or opaque                         liquid, but mucosa seen well), Transverse Colon = 3                         (entire mucosa seen well with no residual staining,                         small fragments of stool or opaque liquid) and Left                         Colon = 3 (entire mucosa seen well with no residual                         staining, small fragments of stool or opaque liquid).                         The total BBPS score equals 8. The quality of the                         bowel preparation was excellent. The terminal ileum,                         ileocecal valve, appendiceal orifice, and rectum were                         photographed. Findings:      The perianal and digital rectal examinations were normal. Pertinent       negatives include normal sphincter tone.  The terminal ileum appeared normal.      A 2 to 3 mm polyp was found in the transverse colon. The polyp was       sessile. The polyp was removed with a cold biopsy forceps. Resection and       retrieval were complete. Estimated blood loss was minimal.      Normal mucosa was found in the entire colon. Biopsies were taken with a       cold forceps for histology. Estimated blood loss was minimal.      Non-bleeding internal hemorrhoids were found during retroflexion. The       hemorrhoids were Grade I (internal hemorrhoids that do not prolapse).      The exam was otherwise without abnormality on direct and retroflexion       views. Impression:            - The examined portion of the ileum was normal.                        - One 2 to 3 mm polyp in the transverse colon, removed                         with a cold biopsy forceps. Resected and retrieved.                        - Normal mucosa in the entire examined colon. Biopsied.                        - Non-bleeding internal hemorrhoids.                        - The examination was otherwise normal on direct and                          retroflexion views. Recommendation:        - Discharge patient to home.                        - Resume previous diet.                        - Continue present medications.                        - Await pathology results.                        - Repeat colonoscopy for surveillance based on                         pathology results.                        - Return to referring physician as previously                         scheduled. Procedure Code(s):     --- Professional ---                        [phone], Colonoscopy, flexible; with biopsy, single or  multiple Diagnosis Code(s):     --- Professional ---                        Z86.010, Personal history of colonic polyps                        K64.0, First degree hemorrhoids                        K63.5, Polyp of colon CPT copyright 2019 [employer]. All rights reserved. The codes documented in this report are preliminary and upon coder review may  be revised to meet current compliance requirements. Attending Participation:      I personally performed the entire procedure. Volney American, DO Annamaria Helling DO, DO 02/20/2021 8:07:05 AM This report has been signed electronically. Number of Addenda: 0 Note Initiated On: 02/20/2021 7:34 AM Scope Withdrawal Time: 0 hours 14 minutes 57 seconds  Total Procedure Duration: 0 hours 20 minutes 32 seconds  Estimated Blood Loss:  Estimated blood loss was minimal.      [facility]

## 2021-02-20 NOTE — Transfer of Care (Signed)
Immediate Anesthesia Transfer of Care Note  Patient: Gabriela Graham  Procedure(s) Performed: COLONOSCOPY WITH PROPOFOL  Patient Location: PACU  Anesthesia Type:General  Level of Consciousness: awake and sedated  Airway & Oxygen Therapy: Patient Spontanous Breathing and Patient connected to nasal cannula oxygen  Post-op Assessment: Report given to RN and Post -op Vital signs reviewed and stable  Post vital signs: Reviewed and stable  Last Vitals:  Vitals Value Taken Time  BP    Temp    Pulse    Resp    SpO2      Last Pain:  Vitals:   02/20/21 0711  TempSrc: Temporal  PainSc: 0-No pain         Complications: No notable events documented.

## 2021-02-21 ENCOUNTER — Encounter: Payer: Self-pay | Admitting: Gastroenterology

## 2021-02-21 LAB — SURGICAL PATHOLOGY

## 2021-02-26 ENCOUNTER — Other Ambulatory Visit: Payer: Self-pay

## 2021-02-27 ENCOUNTER — Other Ambulatory Visit: Payer: Self-pay

## 2021-02-28 ENCOUNTER — Other Ambulatory Visit: Payer: Self-pay

## 2021-03-02 ENCOUNTER — Other Ambulatory Visit: Payer: Self-pay

## 2021-03-02 MED ORDER — FUROSEMIDE 20 MG PO TABS
20.0000 mg | ORAL_TABLET | Freq: Two times a day (BID) | ORAL | 3 refills | Status: AC
  Filled 2021-03-02 – 2021-06-01 (×2): qty 180, 90d supply, fill #0
  Filled 2021-08-28: qty 180, 90d supply, fill #1
  Filled 2021-11-27: qty 180, 90d supply, fill #2

## 2021-03-02 MED ORDER — POTASSIUM CHLORIDE CRYS ER 20 MEQ PO TBCR
20.0000 meq | EXTENDED_RELEASE_TABLET | Freq: Two times a day (BID) | ORAL | 3 refills | Status: AC
  Filled 2021-03-02 – 2021-11-28 (×2): qty 180, 90d supply, fill #0

## 2021-03-02 MED ORDER — BISOPROLOL FUMARATE 5 MG PO TABS
ORAL_TABLET | ORAL | 3 refills | Status: AC
  Filled 2021-03-02 – 2021-11-28 (×2): qty 90, 90d supply, fill #0

## 2021-03-09 DIAGNOSIS — M955 Acquired deformity of pelvis: Secondary | ICD-10-CM | POA: Diagnosis not present

## 2021-03-09 DIAGNOSIS — M9901 Segmental and somatic dysfunction of cervical region: Secondary | ICD-10-CM | POA: Diagnosis not present

## 2021-03-09 DIAGNOSIS — M9903 Segmental and somatic dysfunction of lumbar region: Secondary | ICD-10-CM | POA: Diagnosis not present

## 2021-03-09 DIAGNOSIS — M9902 Segmental and somatic dysfunction of thoracic region: Secondary | ICD-10-CM | POA: Diagnosis not present

## 2021-03-09 DIAGNOSIS — M546 Pain in thoracic spine: Secondary | ICD-10-CM | POA: Diagnosis not present

## 2021-03-09 DIAGNOSIS — M531 Cervicobrachial syndrome: Secondary | ICD-10-CM | POA: Diagnosis not present

## 2021-03-09 DIAGNOSIS — M9905 Segmental and somatic dysfunction of pelvic region: Secondary | ICD-10-CM | POA: Diagnosis not present

## 2021-03-09 DIAGNOSIS — M5137 Other intervertebral disc degeneration, lumbosacral region: Secondary | ICD-10-CM | POA: Diagnosis not present

## 2021-03-12 MED FILL — Rosuvastatin Calcium Tab 10 MG: ORAL | 90 days supply | Qty: 90 | Fill #2 | Status: AC

## 2021-03-13 ENCOUNTER — Other Ambulatory Visit: Payer: Self-pay

## 2021-04-10 DIAGNOSIS — M546 Pain in thoracic spine: Secondary | ICD-10-CM | POA: Diagnosis not present

## 2021-04-10 DIAGNOSIS — M955 Acquired deformity of pelvis: Secondary | ICD-10-CM | POA: Diagnosis not present

## 2021-04-10 DIAGNOSIS — M9901 Segmental and somatic dysfunction of cervical region: Secondary | ICD-10-CM | POA: Diagnosis not present

## 2021-04-10 DIAGNOSIS — M9902 Segmental and somatic dysfunction of thoracic region: Secondary | ICD-10-CM | POA: Diagnosis not present

## 2021-04-10 DIAGNOSIS — M9905 Segmental and somatic dysfunction of pelvic region: Secondary | ICD-10-CM | POA: Diagnosis not present

## 2021-04-10 DIAGNOSIS — M531 Cervicobrachial syndrome: Secondary | ICD-10-CM | POA: Diagnosis not present

## 2021-04-10 DIAGNOSIS — M9903 Segmental and somatic dysfunction of lumbar region: Secondary | ICD-10-CM | POA: Diagnosis not present

## 2021-04-10 DIAGNOSIS — M5137 Other intervertebral disc degeneration, lumbosacral region: Secondary | ICD-10-CM | POA: Diagnosis not present

## 2021-04-17 ENCOUNTER — Other Ambulatory Visit: Payer: Self-pay

## 2021-04-17 MED ORDER — PREDNISONE 10 MG PO TABS
ORAL_TABLET | ORAL | 0 refills | Status: DC
  Filled 2021-04-17: qty 14, 14d supply, fill #0

## 2021-05-19 ENCOUNTER — Other Ambulatory Visit: Payer: Self-pay

## 2021-05-19 MED ORDER — DIPHENOXYLATE-ATROPINE 2.5-0.025 MG PO TABS
ORAL_TABLET | ORAL | 0 refills | Status: DC
  Filled 2021-05-19: qty 10, 3d supply, fill #0

## 2021-05-23 DIAGNOSIS — Z0000 Encounter for general adult medical examination without abnormal findings: Secondary | ICD-10-CM | POA: Diagnosis not present

## 2021-05-23 DIAGNOSIS — E119 Type 2 diabetes mellitus without complications: Secondary | ICD-10-CM | POA: Diagnosis not present

## 2021-05-25 ENCOUNTER — Other Ambulatory Visit: Payer: Self-pay

## 2021-05-30 ENCOUNTER — Other Ambulatory Visit: Payer: Self-pay

## 2021-05-30 DIAGNOSIS — Z0000 Encounter for general adult medical examination without abnormal findings: Secondary | ICD-10-CM | POA: Diagnosis not present

## 2021-05-30 DIAGNOSIS — E119 Type 2 diabetes mellitus without complications: Secondary | ICD-10-CM | POA: Diagnosis not present

## 2021-05-30 MED ORDER — DIPHENOXYLATE-ATROPINE 2.5-0.025 MG PO TABS
ORAL_TABLET | ORAL | 2 refills | Status: AC
  Filled 2021-05-30: qty 50, 16d supply, fill #0

## 2021-05-30 MED ORDER — ROSUVASTATIN CALCIUM 10 MG PO TABS
10.0000 mg | ORAL_TABLET | Freq: Every day | ORAL | 3 refills | Status: DC
  Filled 2021-05-30: qty 90, 90d supply, fill #0
  Filled 2021-08-28: qty 90, 90d supply, fill #1
  Filled 2021-11-27: qty 90, 90d supply, fill #2
  Filled 2022-03-10: qty 90, 90d supply, fill #3

## 2021-05-30 MED ORDER — DICYCLOMINE HCL 20 MG PO TABS
ORAL_TABLET | ORAL | 11 refills | Status: DC
  Filled 2021-05-30: qty 90, 30d supply, fill #0
  Filled 2021-07-04: qty 90, 30d supply, fill #1
  Filled 2021-08-28: qty 90, 30d supply, fill #2
  Filled 2021-11-27: qty 90, 30d supply, fill #3
  Filled 2022-03-10: qty 90, 30d supply, fill #4

## 2021-05-30 MED ORDER — PREDNISONE 10 MG PO TABS
ORAL_TABLET | ORAL | 0 refills | Status: AC
  Filled 2021-05-30: qty 14, 14d supply, fill #0

## 2021-05-30 MED ORDER — ALPRAZOLAM 0.5 MG PO TABS
ORAL_TABLET | ORAL | 1 refills | Status: DC
  Filled 2021-07-09 – 2021-07-14 (×2): qty 180, 90d supply, fill #0
  Filled 2021-10-15: qty 180, 90d supply, fill #1

## 2021-05-30 MED ORDER — OMEPRAZOLE 40 MG PO CPDR
DELAYED_RELEASE_CAPSULE | ORAL | 3 refills | Status: DC
  Filled 2021-05-30: qty 90, 90d supply, fill #0
  Filled 2021-08-28: qty 90, 90d supply, fill #1
  Filled 2021-11-27: qty 90, 90d supply, fill #2
  Filled 2022-05-19: qty 90, 90d supply, fill #3

## 2021-06-01 ENCOUNTER — Other Ambulatory Visit: Payer: Self-pay

## 2021-06-12 DIAGNOSIS — M531 Cervicobrachial syndrome: Secondary | ICD-10-CM | POA: Diagnosis not present

## 2021-06-12 DIAGNOSIS — M546 Pain in thoracic spine: Secondary | ICD-10-CM | POA: Diagnosis not present

## 2021-06-12 DIAGNOSIS — M9903 Segmental and somatic dysfunction of lumbar region: Secondary | ICD-10-CM | POA: Diagnosis not present

## 2021-06-12 DIAGNOSIS — M9905 Segmental and somatic dysfunction of pelvic region: Secondary | ICD-10-CM | POA: Diagnosis not present

## 2021-06-12 DIAGNOSIS — M5137 Other intervertebral disc degeneration, lumbosacral region: Secondary | ICD-10-CM | POA: Diagnosis not present

## 2021-06-12 DIAGNOSIS — M9902 Segmental and somatic dysfunction of thoracic region: Secondary | ICD-10-CM | POA: Diagnosis not present

## 2021-06-12 DIAGNOSIS — M955 Acquired deformity of pelvis: Secondary | ICD-10-CM | POA: Diagnosis not present

## 2021-06-12 DIAGNOSIS — M9901 Segmental and somatic dysfunction of cervical region: Secondary | ICD-10-CM | POA: Diagnosis not present

## 2021-06-22 ENCOUNTER — Other Ambulatory Visit: Payer: Self-pay

## 2021-06-22 MED ORDER — ONDANSETRON 4 MG PO TBDP
ORAL_TABLET | ORAL | 1 refills | Status: AC
  Filled 2021-06-22: qty 40, 14d supply, fill #0

## 2021-06-28 DIAGNOSIS — Z9841 Cataract extraction status, right eye: Secondary | ICD-10-CM | POA: Diagnosis not present

## 2021-06-28 DIAGNOSIS — H35342 Macular cyst, hole, or pseudohole, left eye: Secondary | ICD-10-CM | POA: Diagnosis not present

## 2021-06-28 DIAGNOSIS — H2513 Age-related nuclear cataract, bilateral: Secondary | ICD-10-CM | POA: Diagnosis not present

## 2021-06-28 DIAGNOSIS — H31301 Unspecified choroidal hemorrhage, right eye: Secondary | ICD-10-CM | POA: Diagnosis not present

## 2021-06-28 DIAGNOSIS — Z961 Presence of intraocular lens: Secondary | ICD-10-CM | POA: Diagnosis not present

## 2021-07-04 ENCOUNTER — Other Ambulatory Visit: Payer: Self-pay

## 2021-07-10 ENCOUNTER — Other Ambulatory Visit: Payer: Self-pay

## 2021-07-10 DIAGNOSIS — M546 Pain in thoracic spine: Secondary | ICD-10-CM | POA: Diagnosis not present

## 2021-07-10 DIAGNOSIS — M531 Cervicobrachial syndrome: Secondary | ICD-10-CM | POA: Diagnosis not present

## 2021-07-10 DIAGNOSIS — M9903 Segmental and somatic dysfunction of lumbar region: Secondary | ICD-10-CM | POA: Diagnosis not present

## 2021-07-10 DIAGNOSIS — M9901 Segmental and somatic dysfunction of cervical region: Secondary | ICD-10-CM | POA: Diagnosis not present

## 2021-07-10 DIAGNOSIS — M5137 Other intervertebral disc degeneration, lumbosacral region: Secondary | ICD-10-CM | POA: Diagnosis not present

## 2021-07-10 DIAGNOSIS — M9902 Segmental and somatic dysfunction of thoracic region: Secondary | ICD-10-CM | POA: Diagnosis not present

## 2021-07-10 DIAGNOSIS — M955 Acquired deformity of pelvis: Secondary | ICD-10-CM | POA: Diagnosis not present

## 2021-07-10 DIAGNOSIS — M9905 Segmental and somatic dysfunction of pelvic region: Secondary | ICD-10-CM | POA: Diagnosis not present

## 2021-07-11 ENCOUNTER — Other Ambulatory Visit: Payer: Self-pay

## 2021-07-14 ENCOUNTER — Other Ambulatory Visit: Payer: Self-pay

## 2021-07-19 ENCOUNTER — Other Ambulatory Visit: Payer: Self-pay

## 2021-07-19 MED ORDER — OZEMPIC (0.25 OR 0.5 MG/DOSE) 2 MG/1.5ML ~~LOC~~ SOPN
PEN_INJECTOR | SUBCUTANEOUS | 5 refills | Status: DC
  Filled 2021-07-19: qty 1.5, 56d supply, fill #0
  Filled 2021-08-19: qty 1.5, 56d supply, fill #1

## 2021-08-14 ENCOUNTER — Other Ambulatory Visit: Payer: Self-pay

## 2021-08-14 MED ORDER — AZELASTINE HCL 0.1 % NA SOLN
NASAL | 5 refills | Status: AC
  Filled 2021-08-14: qty 30, 30d supply, fill #0
  Filled 2021-11-11: qty 30, 30d supply, fill #1

## 2021-08-21 ENCOUNTER — Other Ambulatory Visit: Payer: Self-pay

## 2021-08-21 MED ORDER — OZEMPIC (0.25 OR 0.5 MG/DOSE) 2 MG/1.5ML ~~LOC~~ SOPN
PEN_INJECTOR | SUBCUTANEOUS | 5 refills | Status: AC
  Filled 2021-08-21: qty 1.5, 28d supply, fill #0

## 2021-08-23 ENCOUNTER — Other Ambulatory Visit: Payer: Self-pay

## 2021-08-24 ENCOUNTER — Other Ambulatory Visit: Payer: Self-pay

## 2021-08-24 MED ORDER — OZEMPIC (0.25 OR 0.5 MG/DOSE) 2 MG/1.5ML ~~LOC~~ SOPN
PEN_INJECTOR | SUBCUTANEOUS | 5 refills | Status: AC
  Filled 2021-08-24: qty 1.5, 28d supply, fill #0

## 2021-08-29 ENCOUNTER — Other Ambulatory Visit: Payer: Self-pay

## 2021-08-30 ENCOUNTER — Other Ambulatory Visit: Payer: Self-pay

## 2021-09-13 ENCOUNTER — Other Ambulatory Visit: Payer: Self-pay

## 2021-09-13 MED ORDER — OZEMPIC (1 MG/DOSE) 4 MG/3ML ~~LOC~~ SOPN
PEN_INJECTOR | SUBCUTANEOUS | 5 refills | Status: AC
  Filled 2021-09-13: qty 3, 28d supply, fill #0
  Filled 2021-10-19: qty 3, 28d supply, fill #1
  Filled 2021-11-11: qty 3, 28d supply, fill #2

## 2021-10-17 ENCOUNTER — Other Ambulatory Visit: Payer: Self-pay

## 2021-10-19 ENCOUNTER — Other Ambulatory Visit: Payer: Self-pay

## 2021-11-12 ENCOUNTER — Other Ambulatory Visit: Payer: Self-pay

## 2021-11-13 ENCOUNTER — Other Ambulatory Visit: Payer: Self-pay

## 2021-11-15 ENCOUNTER — Other Ambulatory Visit (HOSPITAL_COMMUNITY): Payer: Self-pay

## 2021-11-27 ENCOUNTER — Other Ambulatory Visit: Payer: Self-pay

## 2021-11-28 ENCOUNTER — Other Ambulatory Visit: Payer: Self-pay

## 2021-11-29 ENCOUNTER — Other Ambulatory Visit: Payer: Self-pay

## 2021-11-29 MED ORDER — FUROSEMIDE 20 MG PO TABS
20.0000 mg | ORAL_TABLET | Freq: Two times a day (BID) | ORAL | 3 refills | Status: AC
  Filled 2021-11-29 – 2022-03-11 (×2): qty 180, 90d supply, fill #0
  Filled 2022-06-09: qty 180, 90d supply, fill #1

## 2021-11-29 MED ORDER — BISOPROLOL FUMARATE 5 MG PO TABS
ORAL_TABLET | ORAL | 3 refills | Status: AC
  Filled 2021-11-29 – 2022-03-11 (×2): qty 90, 90d supply, fill #0
  Filled 2022-06-09: qty 90, 90d supply, fill #1
  Filled 2022-09-21: qty 90, 90d supply, fill #2

## 2021-11-29 MED ORDER — OZEMPIC (1 MG/DOSE) 4 MG/3ML ~~LOC~~ SOPN
PEN_INJECTOR | SUBCUTANEOUS | 6 refills | Status: AC
  Filled 2021-11-29: qty 3, 28d supply, fill #0
  Filled 2022-01-06 – 2022-01-24 (×3): qty 3, 28d supply, fill #1

## 2021-11-29 MED ORDER — BUDESONIDE-FORMOTEROL FUMARATE 80-4.5 MCG/ACT IN AERO
INHALATION_SPRAY | RESPIRATORY_TRACT | 3 refills | Status: AC
  Filled 2021-11-29: qty 10.2, 30d supply, fill #0

## 2021-11-29 MED ORDER — POTASSIUM CHLORIDE CRYS ER 20 MEQ PO TBCR
20.0000 meq | EXTENDED_RELEASE_TABLET | Freq: Two times a day (BID) | ORAL | 3 refills | Status: AC
  Filled 2021-11-29 – 2022-03-11 (×2): qty 180, 90d supply, fill #0
  Filled 2022-06-09: qty 180, 90d supply, fill #1
  Filled 2022-09-08: qty 180, 90d supply, fill #2

## 2021-11-29 MED ORDER — ALPRAZOLAM 0.5 MG PO TABS
ORAL_TABLET | ORAL | 1 refills | Status: DC
  Filled 2022-01-06: qty 180, fill #0
  Filled 2022-04-07: qty 180, 90d supply, fill #0

## 2021-11-30 ENCOUNTER — Other Ambulatory Visit: Payer: Self-pay

## 2021-11-30 MED ORDER — VALACYCLOVIR HCL 500 MG PO TABS
500.0000 mg | ORAL_TABLET | Freq: Two times a day (BID) | ORAL | 3 refills | Status: AC
Start: 2021-11-30 — End: ?
  Filled 2021-11-30: qty 180, 90d supply, fill #0

## 2021-12-01 ENCOUNTER — Other Ambulatory Visit: Payer: Self-pay

## 2021-12-04 ENCOUNTER — Other Ambulatory Visit: Payer: Self-pay

## 2021-12-29 ENCOUNTER — Other Ambulatory Visit: Payer: Self-pay

## 2022-01-01 ENCOUNTER — Other Ambulatory Visit: Payer: Self-pay

## 2022-01-01 MED ORDER — MOXIFLOXACIN HCL 0.5 % OP SOLN
OPHTHALMIC | 0 refills | Status: DC
  Filled 2022-01-01: qty 3, 10d supply, fill #0

## 2022-01-01 MED ORDER — PREDNISOLONE ACETATE 1 % OP SUSP
OPHTHALMIC | 0 refills | Status: DC
  Filled 2022-01-01: qty 10, 30d supply, fill #0

## 2022-01-07 ENCOUNTER — Other Ambulatory Visit: Payer: Self-pay

## 2022-01-08 ENCOUNTER — Other Ambulatory Visit: Payer: Self-pay

## 2022-01-11 ENCOUNTER — Other Ambulatory Visit: Payer: Self-pay

## 2022-01-24 ENCOUNTER — Other Ambulatory Visit: Payer: Self-pay

## 2022-03-10 ENCOUNTER — Other Ambulatory Visit: Payer: Self-pay

## 2022-03-11 ENCOUNTER — Other Ambulatory Visit: Payer: Self-pay

## 2022-03-12 ENCOUNTER — Other Ambulatory Visit: Payer: Self-pay

## 2022-03-13 ENCOUNTER — Other Ambulatory Visit: Payer: Self-pay

## 2022-03-15 ENCOUNTER — Other Ambulatory Visit: Payer: Self-pay

## 2022-03-15 MED ORDER — BISOPROLOL FUMARATE 5 MG PO TABS
ORAL_TABLET | ORAL | 3 refills | Status: AC
  Filled 2022-03-15 – 2022-12-23 (×2): qty 90, 90d supply, fill #0

## 2022-03-15 MED ORDER — POTASSIUM CHLORIDE CRYS ER 20 MEQ PO TBCR
20.0000 meq | EXTENDED_RELEASE_TABLET | Freq: Two times a day (BID) | ORAL | 3 refills | Status: AC
  Filled 2022-03-15 – 2022-12-10 (×2): qty 180, 90d supply, fill #0
  Filled 2023-03-10: qty 180, 90d supply, fill #1

## 2022-03-15 MED ORDER — FUROSEMIDE 20 MG PO TABS
20.0000 mg | ORAL_TABLET | Freq: Two times a day (BID) | ORAL | 3 refills | Status: AC
  Filled 2022-03-15 – 2022-12-10 (×2): qty 180, 90d supply, fill #0
  Filled 2023-03-10: qty 180, 90d supply, fill #1

## 2022-03-30 ENCOUNTER — Other Ambulatory Visit: Payer: Self-pay

## 2022-03-30 MED ORDER — TRULICITY 0.75 MG/0.5ML ~~LOC~~ SOAJ
0.7500 mg | SUBCUTANEOUS | 5 refills | Status: AC
  Filled 2022-03-30: qty 2, 28d supply, fill #0

## 2022-03-30 MED ORDER — AMLODIPINE BESYLATE 5 MG PO TABS
5.0000 mg | ORAL_TABLET | Freq: Every day | ORAL | 3 refills | Status: AC
  Filled 2022-03-30: qty 85, 85d supply, fill #0
  Filled 2022-03-30: qty 5, 5d supply, fill #0
  Filled 2022-06-09: qty 90, 90d supply, fill #1
  Filled 2022-09-21: qty 90, 90d supply, fill #2
  Filled 2022-12-22: qty 90, 90d supply, fill #3

## 2022-03-30 MED ORDER — METFORMIN HCL 500 MG PO TABS
500.0000 mg | ORAL_TABLET | Freq: Two times a day (BID) | ORAL | 3 refills | Status: AC
  Filled 2022-03-30: qty 180, 90d supply, fill #0
  Filled 2022-06-09: qty 180, 90d supply, fill #1

## 2022-04-08 ENCOUNTER — Other Ambulatory Visit: Payer: Self-pay

## 2022-04-09 ENCOUNTER — Other Ambulatory Visit: Payer: Self-pay

## 2022-04-23 ENCOUNTER — Other Ambulatory Visit: Payer: Self-pay

## 2022-04-23 MED ORDER — TRULICITY 1.5 MG/0.5ML ~~LOC~~ SOAJ
SUBCUTANEOUS | 5 refills | Status: AC
  Filled 2022-04-23: qty 2, 28d supply, fill #0
  Filled 2022-05-19: qty 2, 28d supply, fill #1
  Filled 2022-06-25: qty 2, 28d supply, fill #2

## 2022-06-09 ENCOUNTER — Other Ambulatory Visit: Payer: Self-pay

## 2022-06-12 ENCOUNTER — Other Ambulatory Visit: Payer: Self-pay

## 2022-06-12 MED ORDER — ROSUVASTATIN CALCIUM 10 MG PO TABS
10.0000 mg | ORAL_TABLET | Freq: Every day | ORAL | 3 refills | Status: AC
  Filled 2022-06-12: qty 90, 90d supply, fill #0
  Filled 2022-09-08: qty 90, 90d supply, fill #1
  Filled 2022-12-08: qty 90, 90d supply, fill #2
  Filled 2023-03-10: qty 90, 90d supply, fill #3

## 2022-06-14 ENCOUNTER — Other Ambulatory Visit: Payer: Self-pay

## 2022-06-28 ENCOUNTER — Other Ambulatory Visit: Payer: Self-pay

## 2022-06-28 MED ORDER — GLIPIZIDE ER 5 MG PO TB24
5.0000 mg | ORAL_TABLET | Freq: Every day | ORAL | 3 refills | Status: DC
  Filled 2022-06-28: qty 90, 90d supply, fill #0
  Filled 2022-09-21: qty 90, 90d supply, fill #1
  Filled 2022-12-22: qty 90, 90d supply, fill #2
  Filled 2023-03-24: qty 90, 90d supply, fill #3

## 2022-06-28 MED ORDER — JARDIANCE 10 MG PO TABS
10.0000 mg | ORAL_TABLET | Freq: Every day | ORAL | 3 refills | Status: DC
  Filled 2022-06-28: qty 90, 90d supply, fill #0
  Filled 2022-09-21: qty 90, 90d supply, fill #1
  Filled 2022-12-22: qty 90, 90d supply, fill #2
  Filled 2023-03-24: qty 90, 90d supply, fill #3

## 2022-07-07 ENCOUNTER — Other Ambulatory Visit: Payer: Self-pay

## 2022-07-09 ENCOUNTER — Other Ambulatory Visit (HOSPITAL_COMMUNITY): Payer: Self-pay

## 2022-07-10 ENCOUNTER — Other Ambulatory Visit: Payer: Self-pay

## 2022-07-10 MED ORDER — ALPRAZOLAM 0.5 MG PO TABS
0.5000 mg | ORAL_TABLET | Freq: Two times a day (BID) | ORAL | 1 refills | Status: DC | PRN
  Filled 2022-07-10: qty 180, 90d supply, fill #0
  Filled 2022-10-06: qty 180, 90d supply, fill #1

## 2022-09-07 ENCOUNTER — Other Ambulatory Visit: Payer: Self-pay

## 2022-09-07 MED ORDER — AZELASTINE HCL 0.1 % NA SOLN
1.0000 | Freq: Two times a day (BID) | NASAL | 5 refills | Status: AC
  Filled 2022-09-07: qty 30, 90d supply, fill #0

## 2022-09-07 MED ORDER — ESCITALOPRAM OXALATE 5 MG PO TABS
5.0000 mg | ORAL_TABLET | Freq: Every day | ORAL | 2 refills | Status: DC
  Filled 2022-09-07: qty 30, 30d supply, fill #0
  Filled 2022-10-06: qty 30, 30d supply, fill #1
  Filled 2022-11-03: qty 30, 30d supply, fill #2

## 2022-09-08 ENCOUNTER — Other Ambulatory Visit: Payer: Self-pay

## 2022-09-10 ENCOUNTER — Other Ambulatory Visit: Payer: Self-pay

## 2022-09-10 MED ORDER — OMEPRAZOLE 40 MG PO CPDR
40.0000 mg | DELAYED_RELEASE_CAPSULE | Freq: Every day | ORAL | 3 refills | Status: DC
  Filled 2022-09-10: qty 90, 90d supply, fill #0
  Filled 2022-12-08: qty 90, 90d supply, fill #1
  Filled 2023-03-10: qty 90, 90d supply, fill #2
  Filled 2023-06-09: qty 90, 90d supply, fill #3

## 2022-09-24 ENCOUNTER — Other Ambulatory Visit (HOSPITAL_COMMUNITY): Payer: Self-pay

## 2022-10-08 ENCOUNTER — Other Ambulatory Visit: Payer: Self-pay

## 2022-10-08 MED ORDER — DICLOFENAC SODIUM 3 % EX GEL
1.0000 | Freq: Two times a day (BID) | CUTANEOUS | 1 refills | Status: AC
  Filled 2022-10-08: qty 100, 30d supply, fill #0

## 2022-11-03 ENCOUNTER — Other Ambulatory Visit: Payer: Self-pay

## 2022-11-05 ENCOUNTER — Other Ambulatory Visit: Payer: Self-pay

## 2022-11-08 ENCOUNTER — Other Ambulatory Visit: Payer: Self-pay

## 2022-11-08 MED ORDER — DICYCLOMINE HCL 20 MG PO TABS
20.0000 mg | ORAL_TABLET | Freq: Three times a day (TID) | ORAL | 11 refills | Status: DC
  Filled 2022-11-08: qty 90, 30d supply, fill #0
  Filled 2023-01-05: qty 90, 30d supply, fill #1
  Filled 2023-04-21: qty 90, 30d supply, fill #2
  Filled 2023-07-21: qty 90, 30d supply, fill #3
  Filled 2023-10-14: qty 90, 30d supply, fill #4

## 2022-12-01 ENCOUNTER — Other Ambulatory Visit: Payer: Self-pay

## 2022-12-02 ENCOUNTER — Other Ambulatory Visit: Payer: Self-pay

## 2022-12-03 ENCOUNTER — Other Ambulatory Visit: Payer: Self-pay

## 2022-12-03 MED ORDER — ESCITALOPRAM OXALATE 5 MG PO TABS
5.0000 mg | ORAL_TABLET | Freq: Every day | ORAL | 2 refills | Status: DC
  Filled 2022-12-03: qty 30, 30d supply, fill #0
  Filled 2023-01-05: qty 30, 30d supply, fill #1
  Filled 2023-04-21: qty 30, 30d supply, fill #2

## 2022-12-08 ENCOUNTER — Other Ambulatory Visit: Payer: Self-pay

## 2022-12-10 ENCOUNTER — Other Ambulatory Visit: Payer: Self-pay

## 2022-12-10 MED ORDER — FUROSEMIDE 20 MG PO TABS
20.0000 mg | ORAL_TABLET | Freq: Two times a day (BID) | ORAL | 3 refills | Status: AC
  Filled 2023-06-09 – 2023-06-10 (×2): qty 180, 90d supply, fill #0
  Filled 2023-09-09: qty 180, 90d supply, fill #1
  Filled 2023-12-08: qty 180, 90d supply, fill #2

## 2022-12-10 MED ORDER — POTASSIUM CHLORIDE CRYS ER 20 MEQ PO TBCR
20.0000 meq | EXTENDED_RELEASE_TABLET | Freq: Two times a day (BID) | ORAL | 3 refills | Status: AC
  Filled 2023-06-09 – 2023-06-10 (×2): qty 180, 90d supply, fill #0
  Filled 2023-09-09: qty 180, 90d supply, fill #1
  Filled 2023-12-08: qty 180, 90d supply, fill #2

## 2022-12-22 ENCOUNTER — Other Ambulatory Visit: Payer: Self-pay

## 2022-12-23 ENCOUNTER — Other Ambulatory Visit: Payer: Self-pay

## 2022-12-25 ENCOUNTER — Other Ambulatory Visit: Payer: Self-pay

## 2022-12-25 MED ORDER — BISOPROLOL FUMARATE 5 MG PO TABS
5.0000 mg | ORAL_TABLET | Freq: Every day | ORAL | 3 refills | Status: AC
  Filled 2022-12-25: qty 90, 90d supply, fill #0

## 2022-12-25 MED ORDER — BISOPROLOL FUMARATE 5 MG PO TABS
5.0000 mg | ORAL_TABLET | Freq: Every day | ORAL | 3 refills | Status: AC
  Filled 2022-12-25 – 2023-03-24 (×2): qty 90, 90d supply, fill #0
  Filled 2023-06-16: qty 90, 90d supply, fill #1
  Filled 2023-09-09: qty 90, 90d supply, fill #2
  Filled 2023-12-08: qty 90, 90d supply, fill #3

## 2023-01-05 ENCOUNTER — Other Ambulatory Visit: Payer: Self-pay

## 2023-01-07 ENCOUNTER — Other Ambulatory Visit: Payer: Self-pay

## 2023-01-07 MED ORDER — ALPRAZOLAM 0.5 MG PO TABS
0.5000 mg | ORAL_TABLET | Freq: Two times a day (BID) | ORAL | 1 refills | Status: DC | PRN
  Filled 2023-01-07: qty 180, 90d supply, fill #0
  Filled 2023-04-07: qty 180, 90d supply, fill #1

## 2023-01-08 ENCOUNTER — Other Ambulatory Visit: Payer: Self-pay

## 2023-01-08 MED ORDER — ESCITALOPRAM OXALATE 5 MG PO TABS
5.0000 mg | ORAL_TABLET | Freq: Every day | ORAL | 3 refills | Status: AC
  Filled 2023-01-08: qty 90, 90d supply, fill #0

## 2023-01-08 MED ORDER — ROSUVASTATIN CALCIUM 10 MG PO TABS
10.0000 mg | ORAL_TABLET | Freq: Every day | ORAL | 3 refills | Status: AC
  Filled 2023-01-08 – 2023-06-10 (×3): qty 90, 90d supply, fill #0
  Filled 2023-09-09: qty 90, 90d supply, fill #1
  Filled 2023-12-08: qty 90, 90d supply, fill #2

## 2023-03-10 ENCOUNTER — Other Ambulatory Visit: Payer: Self-pay

## 2023-03-12 ENCOUNTER — Other Ambulatory Visit: Payer: Self-pay

## 2023-03-13 ENCOUNTER — Other Ambulatory Visit: Payer: Self-pay

## 2023-03-13 MED ORDER — VALACYCLOVIR HCL 500 MG PO TABS
500.0000 mg | ORAL_TABLET | Freq: Two times a day (BID) | ORAL | 3 refills | Status: AC
  Filled 2023-03-13: qty 180, 90d supply, fill #0
  Filled 2023-09-29: qty 180, 90d supply, fill #1
  Filled 2023-11-17 – 2023-12-08 (×2): qty 180, 90d supply, fill #2

## 2023-03-15 ENCOUNTER — Other Ambulatory Visit: Payer: Self-pay

## 2023-03-24 ENCOUNTER — Other Ambulatory Visit: Payer: Self-pay

## 2023-03-25 ENCOUNTER — Other Ambulatory Visit: Payer: Self-pay

## 2023-03-26 ENCOUNTER — Other Ambulatory Visit: Payer: Self-pay

## 2023-03-26 MED ORDER — BISOPROLOL FUMARATE 5 MG PO TABS
5.0000 mg | ORAL_TABLET | Freq: Every day | ORAL | 3 refills | Status: AC
  Filled 2023-03-26 – 2024-03-09 (×2): qty 90, 90d supply, fill #0

## 2023-03-26 MED ORDER — AMLODIPINE BESYLATE 5 MG PO TABS
5.0000 mg | ORAL_TABLET | Freq: Every day | ORAL | 3 refills | Status: DC
  Filled 2023-03-26: qty 90, 90d supply, fill #0
  Filled 2023-06-29: qty 90, 90d supply, fill #1
  Filled 2023-09-09: qty 90, 90d supply, fill #2
  Filled 2023-12-08: qty 90, 90d supply, fill #3

## 2023-04-08 ENCOUNTER — Other Ambulatory Visit: Payer: Self-pay

## 2023-04-15 ENCOUNTER — Other Ambulatory Visit: Payer: Self-pay

## 2023-05-31 ENCOUNTER — Other Ambulatory Visit: Payer: Self-pay

## 2023-05-31 MED ORDER — ESCITALOPRAM OXALATE 5 MG PO TABS
5.0000 mg | ORAL_TABLET | Freq: Every day | ORAL | 2 refills | Status: DC
  Filled 2023-05-31: qty 30, 30d supply, fill #0
  Filled 2023-06-30: qty 30, 30d supply, fill #1

## 2023-06-09 ENCOUNTER — Other Ambulatory Visit: Payer: Self-pay

## 2023-06-10 ENCOUNTER — Other Ambulatory Visit: Payer: Self-pay

## 2023-06-10 MED ORDER — POTASSIUM CHLORIDE CRYS ER 20 MEQ PO TBCR
20.0000 meq | EXTENDED_RELEASE_TABLET | Freq: Two times a day (BID) | ORAL | 3 refills | Status: AC
  Filled 2023-06-10 – 2024-03-09 (×2): qty 180, 90d supply, fill #0
  Filled 2024-06-05: qty 180, 90d supply, fill #1

## 2023-06-10 MED ORDER — ROSUVASTATIN CALCIUM 10 MG PO TABS
10.0000 mg | ORAL_TABLET | Freq: Every day | ORAL | 3 refills | Status: AC
  Filled 2023-06-10 – 2024-03-09 (×2): qty 90, 90d supply, fill #0
  Filled 2024-06-05: qty 90, 90d supply, fill #1

## 2023-06-10 MED ORDER — FUROSEMIDE 20 MG PO TABS
20.0000 mg | ORAL_TABLET | Freq: Two times a day (BID) | ORAL | 3 refills | Status: AC
  Filled 2023-06-10 – 2024-03-09 (×2): qty 180, 90d supply, fill #0
  Filled 2024-06-05: qty 180, 90d supply, fill #1

## 2023-06-16 ENCOUNTER — Other Ambulatory Visit: Payer: Self-pay

## 2023-06-17 ENCOUNTER — Other Ambulatory Visit: Payer: Self-pay

## 2023-06-17 MED ORDER — GLIPIZIDE ER 5 MG PO TB24
5.0000 mg | ORAL_TABLET | Freq: Every day | ORAL | 3 refills | Status: DC
  Filled 2023-06-17: qty 90, 90d supply, fill #0
  Filled 2023-09-09: qty 90, 90d supply, fill #1
  Filled 2023-12-08: qty 90, 90d supply, fill #2
  Filled 2024-03-08: qty 90, 90d supply, fill #3

## 2023-06-17 MED ORDER — JARDIANCE 10 MG PO TABS
10.0000 mg | ORAL_TABLET | Freq: Every day | ORAL | 3 refills | Status: DC
  Filled 2023-06-17: qty 90, 90d supply, fill #0
  Filled 2023-09-09: qty 90, 90d supply, fill #1
  Filled 2023-12-08: qty 90, 90d supply, fill #2
  Filled 2024-03-08: qty 90, 90d supply, fill #3

## 2023-06-30 ENCOUNTER — Other Ambulatory Visit: Payer: Self-pay

## 2023-07-01 ENCOUNTER — Other Ambulatory Visit: Payer: Self-pay

## 2023-07-01 MED ORDER — ALPRAZOLAM 0.5 MG PO TABS
0.5000 mg | ORAL_TABLET | Freq: Two times a day (BID) | ORAL | 1 refills | Status: AC
  Filled 2023-07-01: qty 180, 90d supply, fill #0

## 2023-07-03 ENCOUNTER — Other Ambulatory Visit: Payer: Self-pay

## 2023-07-09 ENCOUNTER — Other Ambulatory Visit: Payer: Self-pay

## 2023-07-09 MED ORDER — ALPRAZOLAM 0.5 MG PO TABS
0.5000 mg | ORAL_TABLET | Freq: Two times a day (BID) | ORAL | 1 refills | Status: DC | PRN
  Filled 2023-07-09: qty 180, 90d supply, fill #0
  Filled 2023-09-29 – 2023-10-04 (×2): qty 180, 90d supply, fill #1

## 2023-08-14 ENCOUNTER — Other Ambulatory Visit: Payer: Self-pay

## 2023-08-14 ENCOUNTER — Other Ambulatory Visit: Payer: Self-pay | Admitting: Internal Medicine

## 2023-08-14 ENCOUNTER — Other Ambulatory Visit (HOSPITAL_COMMUNITY): Payer: Self-pay

## 2023-08-14 DIAGNOSIS — Z1231 Encounter for screening mammogram for malignant neoplasm of breast: Secondary | ICD-10-CM

## 2023-08-14 MED ORDER — VENLAFAXINE HCL ER 37.5 MG PO CP24
37.5000 mg | ORAL_CAPSULE | Freq: Every day | ORAL | 3 refills | Status: AC
Start: 2023-08-14 — End: ?
  Filled 2023-08-14: qty 90, 90d supply, fill #0
  Filled 2023-11-10: qty 90, 90d supply, fill #1
  Filled 2024-02-08: qty 90, 90d supply, fill #2

## 2023-08-14 MED ORDER — OZEMPIC (0.25 OR 0.5 MG/DOSE) 2 MG/3ML ~~LOC~~ SOPN
0.2500 mg | PEN_INJECTOR | SUBCUTANEOUS | 5 refills | Status: DC
Start: 2023-08-14 — End: 2023-12-18
  Filled 2023-08-14 – 2023-08-16 (×2): qty 3, 28d supply, fill #0

## 2023-08-15 ENCOUNTER — Other Ambulatory Visit: Payer: Self-pay

## 2023-08-16 ENCOUNTER — Other Ambulatory Visit: Payer: Self-pay

## 2023-08-20 ENCOUNTER — Other Ambulatory Visit: Payer: Self-pay

## 2023-09-09 ENCOUNTER — Other Ambulatory Visit: Payer: Self-pay

## 2023-09-09 MED ORDER — OMEPRAZOLE 40 MG PO CPDR
40.0000 mg | DELAYED_RELEASE_CAPSULE | Freq: Every day | ORAL | 3 refills | Status: AC
  Filled 2023-09-09: qty 90, 90d supply, fill #0
  Filled 2023-12-08: qty 90, 90d supply, fill #1
  Filled 2024-03-08: qty 90, 90d supply, fill #2
  Filled 2024-06-05: qty 90, 90d supply, fill #3

## 2023-09-23 ENCOUNTER — Other Ambulatory Visit: Payer: Self-pay

## 2023-09-23 MED ORDER — OZEMPIC (0.25 OR 0.5 MG/DOSE) 2 MG/3ML ~~LOC~~ SOPN
0.5000 mg | PEN_INJECTOR | SUBCUTANEOUS | 5 refills | Status: DC
Start: 2023-09-23 — End: 2024-03-10
  Filled 2023-09-23: qty 3, 28d supply, fill #0
  Filled 2023-10-20: qty 3, 28d supply, fill #1
  Filled 2023-11-16: qty 3, 28d supply, fill #2
  Filled 2023-12-15: qty 3, 28d supply, fill #3
  Filled 2024-01-11: qty 3, 28d supply, fill #4
  Filled 2024-02-08: qty 3, 28d supply, fill #5

## 2023-09-24 ENCOUNTER — Other Ambulatory Visit: Payer: Self-pay

## 2023-09-30 ENCOUNTER — Other Ambulatory Visit: Payer: Self-pay

## 2023-11-11 ENCOUNTER — Other Ambulatory Visit: Payer: Self-pay

## 2023-11-17 ENCOUNTER — Other Ambulatory Visit: Payer: Self-pay

## 2023-11-20 ENCOUNTER — Other Ambulatory Visit: Payer: Self-pay

## 2023-12-09 ENCOUNTER — Other Ambulatory Visit: Payer: Self-pay

## 2023-12-18 ENCOUNTER — Other Ambulatory Visit: Payer: Self-pay

## 2023-12-18 MED ORDER — PREDNISONE 10 MG PO TABS
10.0000 mg | ORAL_TABLET | Freq: Every day | ORAL | 0 refills | Status: AC
  Filled 2023-12-18: qty 10, 10d supply, fill #0

## 2023-12-29 ENCOUNTER — Other Ambulatory Visit: Payer: Self-pay

## 2023-12-30 ENCOUNTER — Other Ambulatory Visit: Payer: Self-pay

## 2023-12-30 MED ORDER — DICYCLOMINE HCL 20 MG PO TABS
20.0000 mg | ORAL_TABLET | Freq: Three times a day (TID) | ORAL | 11 refills | Status: AC
  Filled 2023-12-30: qty 90, 30d supply, fill #0
  Filled 2024-01-05 – 2024-03-08 (×2): qty 90, 30d supply, fill #1

## 2023-12-30 MED ORDER — ALPRAZOLAM 0.5 MG PO TABS
0.5000 mg | ORAL_TABLET | Freq: Two times a day (BID) | ORAL | 1 refills | Status: AC | PRN
  Filled 2024-01-05: qty 180, 90d supply, fill #0
  Filled 2024-03-28 – 2024-04-04 (×2): qty 180, 90d supply, fill #1

## 2024-01-05 ENCOUNTER — Other Ambulatory Visit: Payer: Self-pay

## 2024-02-19 ENCOUNTER — Other Ambulatory Visit: Payer: Self-pay

## 2024-02-19 MED ORDER — DOXYCYCLINE HYCLATE 100 MG PO CAPS
100.0000 mg | ORAL_CAPSULE | Freq: Two times a day (BID) | ORAL | 0 refills | Status: AC
  Filled 2024-02-19: qty 14, 7d supply, fill #0

## 2024-02-19 MED ORDER — PREDNISONE 20 MG PO TABS
20.0000 mg | ORAL_TABLET | Freq: Every day | ORAL | 0 refills | Status: AC
  Filled 2024-02-19: qty 7, 7d supply, fill #0

## 2024-03-08 ENCOUNTER — Other Ambulatory Visit: Payer: Self-pay

## 2024-03-09 ENCOUNTER — Other Ambulatory Visit: Payer: Self-pay

## 2024-03-10 ENCOUNTER — Other Ambulatory Visit: Payer: Self-pay

## 2024-03-10 MED ORDER — FUROSEMIDE 20 MG PO TABS
20.0000 mg | ORAL_TABLET | Freq: Two times a day (BID) | ORAL | 3 refills | Status: AC
  Filled 2024-03-10: qty 180, 90d supply, fill #0

## 2024-03-10 MED ORDER — POTASSIUM CHLORIDE CRYS ER 20 MEQ PO TBCR
20.0000 meq | EXTENDED_RELEASE_TABLET | Freq: Two times a day (BID) | ORAL | 3 refills | Status: AC
  Filled 2024-03-10: qty 180, 90d supply, fill #0

## 2024-03-10 MED ORDER — OZEMPIC (0.25 OR 0.5 MG/DOSE) 2 MG/3ML ~~LOC~~ SOPN
0.5000 mg | PEN_INJECTOR | SUBCUTANEOUS | 5 refills | Status: DC
  Filled 2024-03-10: qty 3, 28d supply, fill #0
  Filled 2024-04-04: qty 3, 28d supply, fill #1

## 2024-03-10 MED ORDER — AMLODIPINE BESYLATE 5 MG PO TABS
5.0000 mg | ORAL_TABLET | Freq: Every day | ORAL | 3 refills | Status: AC
  Filled 2024-03-10: qty 90, 90d supply, fill #0

## 2024-03-10 MED ORDER — ROSUVASTATIN CALCIUM 10 MG PO TABS
10.0000 mg | ORAL_TABLET | Freq: Every day | ORAL | 3 refills | Status: AC
  Filled 2024-03-10: qty 90, 90d supply, fill #0

## 2024-03-10 MED ORDER — BISOPROLOL FUMARATE 5 MG PO TABS
5.0000 mg | ORAL_TABLET | Freq: Every day | ORAL | 3 refills | Status: AC
  Filled 2024-03-10 – 2024-06-06 (×2): qty 90, 90d supply, fill #0

## 2024-03-16 ENCOUNTER — Other Ambulatory Visit: Payer: Self-pay

## 2024-03-17 ENCOUNTER — Other Ambulatory Visit: Payer: Self-pay

## 2024-03-30 ENCOUNTER — Other Ambulatory Visit: Payer: Self-pay

## 2024-04-01 ENCOUNTER — Other Ambulatory Visit: Payer: Self-pay

## 2024-04-27 ENCOUNTER — Other Ambulatory Visit: Payer: Self-pay

## 2024-04-27 MED ORDER — VENLAFAXINE HCL ER 37.5 MG PO CP24
37.5000 mg | ORAL_CAPSULE | Freq: Every day | ORAL | 3 refills | Status: AC
  Filled 2024-04-27: qty 90, 90d supply, fill #0

## 2024-04-27 MED ORDER — OZEMPIC (1 MG/DOSE) 4 MG/3ML ~~LOC~~ SOPN
1.0000 mg | PEN_INJECTOR | SUBCUTANEOUS | 8 refills | Status: AC
  Filled 2024-04-27 – 2024-04-30 (×2): qty 3, 28d supply, fill #0
  Filled 2024-05-23 – 2024-05-25 (×2): qty 3, 28d supply, fill #1
  Filled 2024-06-22: qty 3, 28d supply, fill #2

## 2024-04-27 MED ORDER — GLIPIZIDE ER 5 MG PO TB24
5.0000 mg | ORAL_TABLET | Freq: Every day | ORAL | 3 refills | Status: AC
  Filled 2024-04-27 – 2024-06-05 (×2): qty 90, 90d supply, fill #0

## 2024-04-30 ENCOUNTER — Other Ambulatory Visit: Payer: Self-pay

## 2024-05-23 ENCOUNTER — Other Ambulatory Visit: Payer: Self-pay

## 2024-05-25 ENCOUNTER — Other Ambulatory Visit: Payer: Self-pay

## 2024-06-05 ENCOUNTER — Other Ambulatory Visit: Payer: Self-pay

## 2024-06-06 ENCOUNTER — Other Ambulatory Visit: Payer: Self-pay

## 2024-06-07 ENCOUNTER — Other Ambulatory Visit: Payer: Self-pay

## 2024-06-09 ENCOUNTER — Other Ambulatory Visit: Payer: Self-pay

## 2024-06-09 MED ORDER — JARDIANCE 10 MG PO TABS
10.0000 mg | ORAL_TABLET | Freq: Every day | ORAL | 3 refills | Status: AC
  Filled 2024-06-09: qty 90, 90d supply, fill #0

## 2024-06-09 MED ORDER — BISOPROLOL FUMARATE 5 MG PO TABS
5.0000 mg | ORAL_TABLET | Freq: Every day | ORAL | 3 refills | Status: AC
  Filled 2024-06-09: qty 90, 90d supply, fill #0

## 2024-06-09 MED ORDER — BISOPROLOL FUMARATE 5 MG PO TABS: 5.0000 mg | ORAL_TABLET | Freq: Every day | ORAL | 3 refills | Status: AC

## 2024-06-11 ENCOUNTER — Other Ambulatory Visit: Payer: Self-pay

## 2024-06-24 ENCOUNTER — Other Ambulatory Visit: Payer: Self-pay
